# Patient Record
Sex: Male | Born: 1965 | Race: White | Hispanic: No | Smoking: Former smoker
Health system: Southern US, Community
[De-identification: ages and names within clinical notes are randomized; demographics above are authoritative.]

## PROBLEM LIST (undated history)

## (undated) DIAGNOSIS — N200 Calculus of kidney: Secondary | ICD-10-CM

## (undated) DIAGNOSIS — F4310 Post-traumatic stress disorder, unspecified: Secondary | ICD-10-CM

## (undated) DIAGNOSIS — R569 Unspecified convulsions: Secondary | ICD-10-CM

## (undated) DIAGNOSIS — F319 Bipolar disorder, unspecified: Secondary | ICD-10-CM

## (undated) HISTORY — PX: HERNIA REPAIR: SHX51

## (undated) HISTORY — PX: AMPUTATION: SHX166

## (undated) HISTORY — PX: BELOW KNEE LEG AMPUTATION: SUR23

---

## 2015-07-23 ENCOUNTER — Encounter (HOSPITAL_COMMUNITY): Payer: Self-pay | Admitting: *Deleted

## 2015-07-23 ENCOUNTER — Emergency Department (HOSPITAL_COMMUNITY)
Admission: EM | Admit: 2015-07-23 | Discharge: 2015-07-24 | Disposition: A | Discharge status: Home / Self Care | Payer: Medicare Other | Attending: Emergency Medicine | Admitting: Emergency Medicine

## 2015-07-23 ENCOUNTER — Emergency Department (HOSPITAL_COMMUNITY): Payer: Medicare Other

## 2015-07-23 DIAGNOSIS — R200 Anesthesia of skin: Secondary | ICD-10-CM | POA: Diagnosis not present

## 2015-07-23 DIAGNOSIS — G40909 Epilepsy, unspecified, not intractable, without status epilepticus: Secondary | ICD-10-CM | POA: Diagnosis not present

## 2015-07-23 DIAGNOSIS — Z79899 Other long term (current) drug therapy: Secondary | ICD-10-CM | POA: Insufficient documentation

## 2015-07-23 DIAGNOSIS — Z87442 Personal history of urinary calculi: Secondary | ICD-10-CM | POA: Insufficient documentation

## 2015-07-23 DIAGNOSIS — Z720 Tobacco use: Secondary | ICD-10-CM | POA: Insufficient documentation

## 2015-07-23 DIAGNOSIS — Z8659 Personal history of other mental and behavioral disorders: Secondary | ICD-10-CM | POA: Diagnosis not present

## 2015-07-23 HISTORY — DX: Calculus of kidney: N20.0

## 2015-07-23 HISTORY — DX: Unspecified convulsions: R56.9

## 2015-07-23 HISTORY — DX: Bipolar disorder, unspecified: F31.9

## 2015-07-23 LAB — I-STAT CHEM 8, ED
CREATININE: 0.9 mg/dL (ref 0.61–1.24)
Calcium, Ion: 1.1 mmol/L — ABNORMAL LOW (ref 1.12–1.23)
Chloride: 103 mmol/L (ref 101–111)
Glucose, Bld: 108 mg/dL — ABNORMAL HIGH (ref 65–99)
HEMATOCRIT: 50 % (ref 39.0–52.0)
Hemoglobin: 17 g/dL (ref 13.0–17.0)
Potassium: 4.5 mmol/L (ref 3.5–5.1)
Sodium: 142 mmol/L (ref 135–145)
TCO2: 24 mmol/L (ref 0–100)

## 2015-07-23 LAB — DIFFERENTIAL
Basophils Absolute: 0.1 10*3/uL (ref 0.0–0.1)
Basophils Relative: 1 %
EOS PCT: 1 %
Eosinophils Absolute: 0.2 10*3/uL (ref 0.0–0.7)
LYMPHS ABS: 3.8 10*3/uL (ref 0.7–4.0)
LYMPHS PCT: 34 %
MONO ABS: 0.7 10*3/uL (ref 0.1–1.0)
MONOS PCT: 6 %
Neutro Abs: 6.5 10*3/uL (ref 1.7–7.7)
Neutrophils Relative %: 58 %

## 2015-07-23 LAB — CBG MONITORING, ED: GLUCOSE-CAPILLARY: 112 mg/dL — AB (ref 65–99)

## 2015-07-23 LAB — CBC
HCT: 45.9 % (ref 39.0–52.0)
HEMOGLOBIN: 16.2 g/dL (ref 13.0–17.0)
MCH: 33.2 pg (ref 26.0–34.0)
MCHC: 35.3 g/dL (ref 30.0–36.0)
MCV: 94.1 fL (ref 78.0–100.0)
Platelets: 278 10*3/uL (ref 150–400)
RBC: 4.88 MIL/uL (ref 4.22–5.81)
RDW: 12.4 % (ref 11.5–15.5)
WBC: 11.2 10*3/uL — ABNORMAL HIGH (ref 4.0–10.5)

## 2015-07-23 LAB — APTT: aPTT: 29 seconds (ref 24–37)

## 2015-07-23 LAB — COMPREHENSIVE METABOLIC PANEL
ALK PHOS: 55 U/L (ref 38–126)
ALT: 81 U/L — ABNORMAL HIGH (ref 17–63)
ANION GAP: 9 (ref 5–15)
AST: 53 U/L — ABNORMAL HIGH (ref 15–41)
Albumin: 3.9 g/dL (ref 3.5–5.0)
BILIRUBIN TOTAL: 0.4 mg/dL (ref 0.3–1.2)
BUN: <5 mg/dL — ABNORMAL LOW (ref 6–20)
CO2: 25 mmol/L (ref 22–32)
Calcium: 9.2 mg/dL (ref 8.9–10.3)
Chloride: 106 mmol/L (ref 101–111)
Creatinine, Ser: 1.01 mg/dL (ref 0.61–1.24)
GLUCOSE: 107 mg/dL — AB (ref 65–99)
Potassium: 4.6 mmol/L (ref 3.5–5.1)
Sodium: 140 mmol/L (ref 135–145)
TOTAL PROTEIN: 7.7 g/dL (ref 6.5–8.1)

## 2015-07-23 LAB — PROTIME-INR
INR: 1.14 (ref 0.00–1.49)
Prothrombin Time: 14.8 seconds (ref 11.6–15.2)

## 2015-07-23 LAB — I-STAT TROPONIN, ED: TROPONIN I, POC: 0 ng/mL (ref 0.00–0.08)

## 2015-07-23 MED ORDER — ACETAMINOPHEN 325 MG PO TABS
650.0000 mg | ORAL_TABLET | Freq: Once | ORAL | Status: DC
  Filled 2015-07-23: qty 2

## 2015-07-23 MED ORDER — SODIUM CHLORIDE 0.9 % IV BOLUS (SEPSIS)
1000.0000 mL | Freq: Once | INTRAVENOUS | Status: AC
  Administered 2015-07-23: 1000 mL via INTRAVENOUS

## 2015-07-23 MED ORDER — HYDROCODONE-ACETAMINOPHEN 5-325 MG PO TABS: 1.0000 | ORAL_TABLET | Freq: Four times a day (QID) | ORAL | Status: DC | PRN

## 2015-07-23 MED ORDER — METOCLOPRAMIDE HCL 5 MG/ML IJ SOLN
20.0000 mg | Freq: Once | INTRAMUSCULAR | Status: DC
  Filled 2015-07-23: qty 4

## 2015-07-23 MED ORDER — IOHEXOL 350 MG/ML SOLN
80.0000 mL | Freq: Once | INTRAVENOUS | Status: AC | PRN
  Administered 2015-07-23: 80 mL via INTRAVENOUS

## 2015-07-23 MED ORDER — HYDROCODONE-ACETAMINOPHEN 5-325 MG PO TABS: 1.0000 | ORAL_TABLET | Freq: Once | ORAL | Status: DC

## 2015-07-23 MED ORDER — METOCLOPRAMIDE HCL 5 MG/ML IJ SOLN
10.0000 mg | Freq: Once | INTRAMUSCULAR | Status: AC
Start: 2015-07-23 — End: 2015-07-23
  Administered 2015-07-23: 10 mg via INTRAVENOUS
  Filled 2015-07-23: qty 2

## 2015-07-23 MED ORDER — DIPHENHYDRAMINE HCL 50 MG/ML IJ SOLN
25.0000 mg | Freq: Once | INTRAMUSCULAR | Status: AC
  Administered 2015-07-23: 25 mg via INTRAVENOUS
  Filled 2015-07-23: qty 1

## 2015-07-23 MED ORDER — ONDANSETRON HCL 4 MG/2ML IJ SOLN
4.0000 mg | Freq: Once | INTRAMUSCULAR | Status: AC
  Administered 2015-07-23: 4 mg via INTRAVENOUS
  Filled 2015-07-23: qty 2

## 2015-07-23 MED ORDER — KETOROLAC TROMETHAMINE 30 MG/ML IJ SOLN
30.0000 mg | Freq: Once | INTRAMUSCULAR | Status: AC
  Administered 2015-07-23: 30 mg via INTRAVENOUS
  Filled 2015-07-23: qty 1

## 2015-07-23 NOTE — ED Notes (Signed)
EDP explained MRI result and plan of care to pt.

## 2015-07-23 NOTE — ED Notes (Signed)
PT states about 45 minutes ago, started having sharp pain to left side of face, shaking all over, blurry vision bilaterally and developed a headache that is going down his neck. PT has left arm numbness and weakness.  Pt states he did have a couple of chest pains

## 2015-07-23 NOTE — Discharge Instructions (Signed)
As discussed, your evaluation today has been largely reassuring.  But, it is important that you monitor your condition carefully, and do not hesitate to return to the ED if you develop new, or concerning changes in your condition.  Your numbness and tingling on the left side are likely related to your headache.  It is important that you discussed today's presentation with your physician, and discuss appropriate medical options for headache control.

## 2015-07-23 NOTE — ED Provider Notes (Signed)
CSN: [id number]     Arrival date & time 07/23/15  1846 History   First MD Initiated Contact with Patient 07/23/15 1917     Chief Complaint  Patient presents with  . Code Stroke    An emergency department physician performed an initial assessment on this suspected stroke patient at 73. (Consider location/radiation/quality/duration/timing/severity/associated sxs/prior Treatment) HPI  Patient presents after the relatively sudden onset of left facial dysesthesia, left arm weakness and dysesthesia, and left leg weakness. Symptoms began about one hour ago. Since onset symptoms of been persistent. There is associated bilateral blurry vision. Patient was well prior to the onset of symptoms.   Additional interview conducted after the patient returned from emergent CT scan. Patient states that he also developed headache, left-sided about the time of symptom onset. Headache is severe. No recent medication changes, diet changes, activity changes.   Past Medical History  Diagnosis Date  . Kidney stones   . Bipolar 1 disorder (HCC)   . Seizures [facility])    Past Surgical History  Procedure Laterality Date  . Amputation    . Hernia repair     No family history on file. Social History  Substance Use Topics  . Smoking status: Current Every Day Smoker  . Smokeless tobacco: None  . Alcohol Use: No    Review of Systems  Constitutional:       Per HPI, otherwise negative  HENT:       Per HPI, otherwise negative  Eyes: Positive for visual disturbance.  Respiratory:       Per HPI, otherwise negative  Cardiovascular:       Per HPI, otherwise negative  Gastrointestinal: Negative for vomiting.  Endocrine:       Negative aside from HPI  Genitourinary:       Neg aside from HPI   Musculoskeletal:       Per HPI, otherwise negative  Skin: Negative.   Neurological: Positive for weakness, numbness and headaches. Negative for syncope.      Allergies  Clindamycin/lincomycin  Home  Medications   Prior to Admission medications   Medication Sig Start Date End Date Taking? Authorizing Provider  dicyclomine (BENTYL) 20 MG tablet Take 20 mg by mouth daily as needed for spasms.  04/26/15  Yes Historical Provider, MD  gabapentin (NEURONTIN) 400 MG capsule Take 400 mg by mouth 3 (three) times daily. 07/17/15  Yes Historical Provider, MD  ibuprofen (ADVIL,MOTRIN) 200 MG tablet Take 200 mg by mouth every 6 (six) hours as needed for mild pain or moderate pain.   Yes Historical Provider, MD  OXcarbazepine (TRILEPTAL) 150 MG tablet Take 150 mg by mouth 3 (three) times daily. 07/17/15  Yes Historical Provider, MD  prazosin (MINIPRESS) 2 MG capsule Take 2 mg by mouth at bedtime. 07/17/15  Yes Historical Provider, MD  SAPHRIS 5 MG SUBL 24 hr tablet Place 5 mg under the tongue at bedtime. 07/17/15  Yes Historical Provider, MD  tamsulosin (FLOMAX) 0.4 MG CAPS capsule Take 0.4 mg by mouth at bedtime. 07/17/15  Yes Historical Provider, MD  venlafaxine XR (EFFEXOR-XR) 150 MG 24 hr capsule Take 150 mg by mouth daily with breakfast.  05/02/15  Yes Historical Provider, MD   BP 151/67 mmHg  Pulse 75  Temp(Src) 98.7 F (37.1 C) (Oral)  Resp 16  SpO2 98% Physical Exam  Constitutional: He is oriented to person, place, and time. He appears well-developed. No distress.  Uncomfortable appearing male  HENT:  Head: Normocephalic and atraumatic.  Eyes: Conjunctivae  and EOM are normal.  Neck: Neck supple. Normal range of motion present.  Cardiovascular: Normal rate and regular rhythm.   Pulmonary/Chest: Effort normal. No stridor. No respiratory distress.  Abdominal: He exhibits no distension.  Musculoskeletal: He exhibits no edema.  Neurological: He is alert and oriented to person, place, and time. He displays no atrophy and no tremor. No cranial nerve deficit or sensory deficit. He exhibits normal muscle tone. He displays no seizure activity. Coordination normal.  Skin: Skin is warm and dry.   Psychiatric: He has a normal mood and affect.  Nursing note and vitals reviewed.   ED Course  Procedures (including critical care time)  Initial evaluation conducted with our neurology colleagues. Patient had emergent CT scan which was unremarkable. With the patient's discription of a new headache, as well as neck pain, patient returned to the CT scan for CT angiography.      Labs Review Labs Reviewed  CBC - Abnormal; Notable for the following:    WBC 11.2 (*)    All other components within normal limits  COMPREHENSIVE METABOLIC PANEL - Abnormal; Notable for the following:    Glucose, Bld 107 (*)    BUN <5 (*)    AST 53 (*)    ALT 81 (*)    All other components within normal limits  CBG MONITORING, ED - Abnormal; Notable for the following:    Glucose-Capillary 112 (*)    All other components within normal limits  I-STAT CHEM 8, ED - Abnormal; Notable for the following:    BUN <3 (*)    Glucose, Bld 108 (*)    Calcium, Ion 1.10 (*)    All other components within normal limits  PROTIME-INR  APTT  DIFFERENTIAL  I-STAT TROPOININ, ED    Imaging Review Ct Angio Head W/cm &/or Wo Cm  07/23/2015   CLINICAL DATA:  Left-sided numbness. Diffuse headache extending down the left side of the neck for 1 hour. Blurry vision. Left-sided weakness. Concern for dissection in the neck.  EXAM: CT ANGIOGRAPHY HEAD AND NECK  TECHNIQUE: Multidetector CT imaging of the head and neck was performed using the standard protocol during bolus administration of intravenous contrast. Multiplanar CT image reconstructions and MIPs were obtained to evaluate the vascular anatomy. Carotid stenosis measurements (when applicable) are obtained utilizing NASCET criteria, using the distal internal carotid diameter as the denominator.  CONTRAST:  80mL OMNIPAQUE IOHEXOL 350 MG/ML SOLN  COMPARISON:  Noncontrast head CT earlier today  FINDINGS: CTA NECK  Aortic arch: 3 vessel aortic arch. Brachiocephalic and left  subclavian arteries are widely patent. Prominent, predominantly noncalcified atherosclerotic plaque in the proximal right subclavian artery results in 50% stenosis.  Right carotid system: Widely patent without evidence of stenosis, aneurysm, or dissection.  Left carotid system: Widely patent without evidence of stenosis, dissection, or aneurysm.  Vertebral arteries: Patent with the left being dominant and the right being diffusely hypoplastic on a developmental basis. Mild non stenotic plaque in the left V3 segment.  Skeleton: Mild multilevel cervical disc and facet degeneration.  Other neck: Mild centrilobular and paraseptal emphysema in the lung apices.  CTA HEAD  Anterior circulation: Internal carotid arteries are patent from skullbase to carotid termini without stenosis. ACAs and MCAs are patent with mild branch vessel irregularity but no evidence of major branch vessel occlusion or significant proximal stenosis. No intracranial aneurysm is identified.  Posterior circulation: Intracranial vertebral arteries are patent. The left vertebral artery supplies the basilar, in the right ends in PICA.  Left PICA origin is patent as well, as are the AICA and SCA origins. Basilar artery is patent without stenosis. Posterior communicating arteries are not clearly identified. PCAs are patent with mild irregularity bilaterally but no significant proximal stenosis.  Venous sinuses: Patent.  Anatomic variants: Hypoplastic right vertebral artery ending in PICA.  Delayed phase: No abnormal enhancement. Nonspecific partially calcified lesion in the right occipital scalp.  IMPRESSION: 1. Widely patent cervical carotid and vertebral arteries without evidence of dissection. 2. 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 3. No medium or large vessel occlusion or significant proximal intracranial stenosis. Mild branch vessel irregularity which may reflect mild atherosclerosis.  These results were called by telephone at the time of  interpretation on 07/23/2015 at 8:12 pm to Dr. Thana Farr , who verbally acknowledged these results.   Electronically Signed   By: Sebastian Ache M.D.   On: 07/23/2015 20:19   Ct Head Wo Contrast  07/23/2015   CLINICAL DATA:  Right facial tingling, blurred vision, headache, history of seizures, code stroke  EXAM: CT HEAD WITHOUT CONTRAST  TECHNIQUE: Contiguous axial images were obtained from the base of the skull through the vertex without intravenous contrast.  COMPARISON:  None.  FINDINGS: No evidence of parenchymal hemorrhage or extra-axial fluid collection. No mass lesion, mass effect, or midline shift.  No CT evidence of acute infarction.  Cerebral volume is within normal limits.  No ventriculomegaly.  The visualized paranasal sinuses are essentially clear. The mastoid air cells are unopacified.  No evidence of calvarial fracture.  IMPRESSION: Normal head CT.  These results were called by telephone at the time of interpretation on 07/23/2015 at 7:24 pm to Dr. Thana Farr, who verbally acknowledged these results.   Electronically Signed   By: Charline Bills M.D.   On: 07/23/2015 19:24   Ct Angio Neck W/cm &/or Wo/cm  07/23/2015   CLINICAL DATA:  Left-sided numbness. Diffuse headache extending down the left side of the neck for 1 hour. Blurry vision. Left-sided weakness. Concern for dissection in the neck.  EXAM: CT ANGIOGRAPHY HEAD AND NECK  TECHNIQUE: Multidetector CT imaging of the head and neck was performed using the standard protocol during bolus administration of intravenous contrast. Multiplanar CT image reconstructions and MIPs were obtained to evaluate the vascular anatomy. Carotid stenosis measurements (when applicable) are obtained utilizing NASCET criteria, using the distal internal carotid diameter as the denominator.  CONTRAST:  80mL OMNIPAQUE IOHEXOL 350 MG/ML SOLN  COMPARISON:  Noncontrast head CT earlier today  FINDINGS: CTA NECK  Aortic arch: 3 vessel aortic arch.  Brachiocephalic and left subclavian arteries are widely patent. Prominent, predominantly noncalcified atherosclerotic plaque in the proximal right subclavian artery results in 50% stenosis.  Right carotid system: Widely patent without evidence of stenosis, aneurysm, or dissection.  Left carotid system: Widely patent without evidence of stenosis, dissection, or aneurysm.  Vertebral arteries: Patent with the left being dominant and the right being diffusely hypoplastic on a developmental basis. Mild non stenotic plaque in the left V3 segment.  Skeleton: Mild multilevel cervical disc and facet degeneration.  Other neck: Mild centrilobular and paraseptal emphysema in the lung apices.  CTA HEAD  Anterior circulation: Internal carotid arteries are patent from skullbase to carotid termini without stenosis. ACAs and MCAs are patent with mild branch vessel irregularity but no evidence of major branch vessel occlusion or significant proximal stenosis. No intracranial aneurysm is identified.  Posterior circulation: Intracranial vertebral arteries are patent. The left vertebral artery supplies the basilar, in the  right ends in PICA. Left PICA origin is patent as well, as are the AICA and SCA origins. Basilar artery is patent without stenosis. Posterior communicating arteries are not clearly identified. PCAs are patent with mild irregularity bilaterally but no significant proximal stenosis.  Venous sinuses: Patent.  Anatomic variants: Hypoplastic right vertebral artery ending in PICA.  Delayed phase: No abnormal enhancement. Nonspecific partially calcified lesion in the right occipital scalp.  IMPRESSION: 1. Widely patent cervical carotid and vertebral arteries without evidence of dissection. 2. 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 3. No medium or large vessel occlusion or significant proximal intracranial stenosis. Mild branch vessel irregularity which may reflect mild atherosclerosis.  These results were called by  telephone at the time of interpretation on 07/23/2015 at 8:12 pm to Dr. Thana Farr , who verbally acknowledged these results.   Electronically Signed   By: Sebastian Ache M.D.   On: 07/23/2015 20:19   I have personally reviewed and evaluated these images and lab results as part of my medical decision-making.   EKG Interpretation   Date/Time:  Tuesday July 23 2015 18:59:15 EDT Ventricular Rate:  89 PR Interval:  142 QRS Duration: 98 QT Interval:  356 QTC Calculation: 433 R Axis:   61 Text Interpretation:  Normal sinus rhythm with sinus arrhythmia Normal ECG  Sinus rhythm Artifact Borderline ECG Confirmed by Gerhard Munch  MD  [phone]) on 07/23/2015 7:17:52 PM      On return from CT angiography, the patient continues to complain of headache. I discussed this case again with our neurology colleagues, the patient will have MRI performed.  11:40 PM Patient awake, alert, speaking clearly, minimal symptoms. After discussion with our neurology colleagues, patient had MRI performed, after his initial evaluation was reassuring, low suspicion for aneurysm, mass MRI none demonstrates no stroke. 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  I discussed all findings with patient at length, discussed importance of taking all medication as directed. Patient requests pain medication for discharge. He was recently on a course of Percocet for kidney stone, states that these medications work well for him. I counseled patient on the need for following up with primary care to discuss appropriate analgesia for headache, given his use of multiple medications for his medical issues. Patient was amenable to following up, voiced understanding of this necessity.  MDM   Final diagnoses:  Left sided numbness  Left sided numbness   Patient presents with the sudden onset of left-sided dysesthesia, blurred  vision, headache. Patient's description of symptoms very slightly, but with concern for mass versus stroke versus dissection, patient had multiple imaging studies performed. Throughout, the patient had analgesia provided via IV medication. Patient remained hemodynamically stable, neurologically intact throughout the duration of his emergency department course. Studies largely reassuring, and with no evidence for ongoing infection, dissection, stroke, mass, the patient was discharged in stable  condition to follow-up with his primary care team.   Gerhard Munch, MD 07/23/15 2342

## 2015-07-23 NOTE — ED Notes (Signed)
Patient transported to MRI 

## 2015-07-23 NOTE — Progress Notes (Signed)
Code Stroke called on 49 y.o male. While driving alone today at 4782 Pt developed acute numbness on the left side of his face. The numbness traveled down his neck to his arm with associated tingling. Pt developed severe headache with blurry vision and weakness in left upper and lower extremity. Pt was able to drive himself to [facility]. Pertinent medical history includes left BKA, smoker and kidney stones. Currently rates his head ache 10/10 with pain behind both eyes. NIHSS completed yielding 1 for sensory impairment on left side. CBG 112. CT scan completed and Pt taken for CTA as well. Negative for acute changes per Neurologist Dr. Thad Ranger  Pt for treatment this evening for headache and MRI.

## 2015-07-23 NOTE — Consult Note (Signed)
Referring Physician: Jeraldine Loots    Chief Complaint: Left sided numbness  HPI: Sean Wyatt is an 49 y.o. male who was driving alone today and had the acute onset of numbness along the left side of his face.  This numbness travelled down his face and neck to his left arm.  He then developed a severe headache, became shaky in both upper extremities and his left lower extremity and felt as if he was about to pass out.  Patient pulled his car over.  Reports that he also felt some heart fluttering.  With no improvement in the numbness the patient presented for evaluation.  Patient is from [place].  Reports no significant history of headaches.  Current headache is frontal.  Rated at a 10/10.  Some associated nausea and although with some initial blurry vision currently has no visual complaints.  Initial NIHSS of 1.   Date last known well: Date: 07/23/2015 Time last known well: Time: 18:15 tPA Given: No: Minimal symptoms  MRankin: 0  Past Medical History  Diagnosis Date  . Kidney stones   . Bipolar 1 disorder (HCC)   . Seizures [facility])     Past Surgical History  Procedure Laterality Date  . Amputation    . Hernia repair      Family history:  Mother deceased from lung cancer.  Father alive with CAD s/p MI X 2.  Social History:  reports that he has been smoking.  He does not have any smokeless tobacco history on file. He reports that he does not drink alcohol or use illicit drugs.  Allergies:  Allergies  Allergen Reactions  . Clindamycin/Lincomycin     Medications: I have reviewed the patient's current medications. Prior to Admission:  Prior to Admission medications   Medication Sig Start Date End Date Taking? Authorizing Provider  dicyclomine (BENTYL) 20 MG tablet Take 20 mg by mouth daily as needed for spasms.  04/26/15  Yes Historical Provider, MD  gabapentin (NEURONTIN) 400 MG capsule Take 400 mg by mouth 3 (three) times daily. 07/17/15  Yes Historical Provider, MD  ibuprofen  (ADVIL,MOTRIN) 200 MG tablet Take 200 mg by mouth every 6 (six) hours as needed for mild pain or moderate pain.   Yes Historical Provider, MD  OXcarbazepine (TRILEPTAL) 150 MG tablet Take 150 mg by mouth 3 (three) times daily. 07/17/15  Yes Historical Provider, MD  prazosin (MINIPRESS) 2 MG capsule Take 2 mg by mouth at bedtime. 07/17/15  Yes Historical Provider, MD  SAPHRIS 5 MG SUBL 24 hr tablet Place 5 mg under the tongue at bedtime. 07/17/15  Yes Historical Provider, MD  tamsulosin (FLOMAX) 0.4 MG CAPS capsule Take 0.4 mg by mouth at bedtime. 07/17/15  Yes Historical Provider, MD  venlafaxine XR (EFFEXOR-XR) 150 MG 24 hr capsule Take 150 mg by mouth daily with breakfast.  05/02/15  Yes Historical Provider, MD    ROS: History obtained from the patient  General ROS: negative for - chills, fatigue, fever, night sweats, weight gain or weight loss Psychological ROS: negative for - behavioral disorder, hallucinations, memory difficulties, mood swings or suicidal ideation Ophthalmic ROS: negative for - blurry vision, double vision, eye pain or loss of vision ENT ROS: negative for - epistaxis, nasal discharge, oral lesions, sore throat, tinnitus or vertigo Allergy and Immunology ROS: negative for - hives or itchy/watery eyes Hematological and Lymphatic ROS: negative for - bleeding problems, bruising or swollen lymph nodes Endocrine ROS: negative for - galactorrhea, hair pattern changes, polydipsia/polyuria or temperature intolerance Respiratory  ROS: negative for - cough, hemoptysis, shortness of breath or wheezing Cardiovascular ROS: as noted in HPI Gastrointestinal ROS: as noted in HPI Genito-Urinary ROS: negative for - dysuria, hematuria, incontinence or urinary frequency/urgency Musculoskeletal ROS: Left BKA Neurological ROS: as noted in HPI Dermatological ROS: negative for rash and skin lesion changes  Physical Examination: Blood pressure 151/67, pulse 75, temperature 98.7 F (37.1 C),  temperature source Oral, resp. rate 16, SpO2 98 %.  HEENT-  Normocephalic, no lesions, without obvious abnormality.  Normal external eye and conjunctiva.  Normal TM's bilaterally.  Normal auditory canals and external ears. Normal external nose, mucus membranes and septum.  Normal pharynx. Cardiovascular- S1, S2 normal, pulses palpable throughout   Lungs- chest clear, no wheezing, rales, normal symmetric air entry Abdomen- soft, non-tender; bowel sounds normal; no masses,  no organomegaly Extremities- no edema, left BKA Lymph-no adenopathy palpable Musculoskeletal-no joint tenderness, deformity or swelling Skin-warm and dry, no hyperpigmentation, vitiligo, or suspicious lesions  Neurological Examination Mental Status: Alert, oriented, thought content appropriate.  Speech fluent without evidence of aphasia.  Able to follow 3 step commands without difficulty. Cranial Nerves: II: Discs flat bilaterally; Visual fields grossly normal, pupils equal, round, reactive to light and accommodation III,IV, VI: ptosis not present, extra-ocular motions intact bilaterally V,VII: smile symmetric, facial light touch sensation decreased on the left VIII: hearing normal bilaterally IX,X: gag reflex present XI: bilateral shoulder shrug XII: midline tongue extension Motor: Right : Upper extremity   5/5    Left:     Upper extremity   5/5  Lower extremity   5/5     Lower extremity   5/5 Tone and bulk:normal tone throughout; no atrophy noted Sensory: Pinprick and light touch decreased on the left upper extremity Deep Tendon Reflexes: 2+ in the upper extremities and right lower extremity.  Left BKA Plantars: Right: downgoing   Left: BKA Cerebellar: Normal finger-to-nose and normal heel-to-shin testing bilaterally Gait: not tested due to pain   Laboratory Studies:  Basic Metabolic Panel:  Recent Labs Lab 07/23/15 1912 07/23/15 1918  NA 140 142  K 4.6 4.5  CL 106 103  CO2 25  --   GLUCOSE 107* 108*   BUN <5* <3*  CREATININE 1.01 0.90  CALCIUM 9.2  --     Liver Function Tests:  Recent Labs Lab 07/23/15 1912  AST 53*  ALT 81*  ALKPHOS 55  BILITOT 0.4  PROT 7.7  ALBUMIN 3.9   No results for input(s): LIPASE, AMYLASE in the last 168 hours. No results for input(s): AMMONIA in the last 168 hours.  CBC:  Recent Labs Lab 07/23/15 1912 07/23/15 1918  WBC 11.2*  --   NEUTROABS 6.5  --   HGB 16.2 17.0  HCT 45.9 50.0  MCV 94.1  --   PLT 278  --     Cardiac Enzymes: No results for input(s): CKTOTAL, CKMB, CKMBINDEX, TROPONINI in the last 168 hours.  BNP: Invalid input(s): POCBNP  CBG:  Recent Labs Lab 07/23/15 1904  GLUCAP 112*    Microbiology: No results found for this or any previous visit.  Coagulation Studies:  Recent Labs  07/23/15 1912  LABPROT 14.8  INR 1.14    Urinalysis: No results for input(s): COLORURINE, LABSPEC, PHURINE, GLUCOSEU, HGBUR, BILIRUBINUR, KETONESUR, PROTEINUR, UROBILINOGEN, NITRITE, LEUKOCYTESUR in the last 168 hours.  Invalid input(s): APPERANCEUR  Lipid Panel: No results found for: CHOL, TRIG, HDL, CHOLHDL, VLDL, LDLCALC  HgbA1C: No results found for: HGBA1C  Urine Drug Screen:  No  results found for: LABOPIA, COCAINSCRNUR, LABBENZ, AMPHETMU, THCU, LABBARB  Alcohol Level: No results for input(s): ETH in the last 168 hours.  Other results: EKG: normal sinus rhythm at 89 bpm.  Imaging: Ct Angio Head W/cm &/or Wo Cm  07/23/2015   CLINICAL DATA:  Left-sided numbness. Diffuse headache extending down the left side of the neck for 1 hour. Blurry vision. Left-sided weakness. Concern for dissection in the neck.  EXAM: CT ANGIOGRAPHY HEAD AND NECK  TECHNIQUE: Multidetector CT imaging of the head and neck was performed using the standard protocol during bolus administration of intravenous contrast. Multiplanar CT image reconstructions and MIPs were obtained to evaluate the vascular anatomy. Carotid stenosis measurements (when  applicable) are obtained utilizing NASCET criteria, using the distal internal carotid diameter as the denominator.  CONTRAST:  80mL OMNIPAQUE IOHEXOL 350 MG/ML SOLN  COMPARISON:  Noncontrast head CT earlier today  FINDINGS: CTA NECK  Aortic arch: 3 vessel aortic arch. Brachiocephalic and left subclavian arteries are widely patent. Prominent, predominantly noncalcified atherosclerotic plaque in the proximal right subclavian artery results in 50% stenosis.  Right carotid system: Widely patent without evidence of stenosis, aneurysm, or dissection.  Left carotid system: Widely patent without evidence of stenosis, dissection, or aneurysm.  Vertebral arteries: Patent with the left being dominant and the right being diffusely hypoplastic on a developmental basis. Mild non stenotic plaque in the left V3 segment.  Skeleton: Mild multilevel cervical disc and facet degeneration.  Other neck: Mild centrilobular and paraseptal emphysema in the lung apices.  CTA HEAD  Anterior circulation: Internal carotid arteries are patent from skullbase to carotid termini without stenosis. ACAs and MCAs are patent with mild branch vessel irregularity but no evidence of major branch vessel occlusion or significant proximal stenosis. No intracranial aneurysm is identified.  Posterior circulation: Intracranial vertebral arteries are patent. The left vertebral artery supplies the basilar, in the right ends in PICA. Left PICA origin is patent as well, as are the AICA and SCA origins. Basilar artery is patent without stenosis. Posterior communicating arteries are not clearly identified. PCAs are patent with mild irregularity bilaterally but no significant proximal stenosis.  Venous sinuses: Patent.  Anatomic variants: Hypoplastic right vertebral artery ending in PICA.  Delayed phase: No abnormal enhancement. Nonspecific partially calcified lesion in the right occipital scalp.  IMPRESSION: 1. Widely patent cervical carotid and vertebral arteries  without evidence of dissection. 2. 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 3. No medium or large vessel occlusion or significant proximal intracranial stenosis. Mild branch vessel irregularity which may reflect mild atherosclerosis.  These results were called by telephone at the time of interpretation on 07/23/2015 at 8:12 pm to Dr. Thana Farr , who verbally acknowledged these results.   Electronically Signed   By: Sebastian Ache M.D.   On: 07/23/2015 20:19   Ct Head Wo Contrast  07/23/2015   CLINICAL DATA:  Right facial tingling, blurred vision, headache, history of seizures, code stroke  EXAM: CT HEAD WITHOUT CONTRAST  TECHNIQUE: Contiguous axial images were obtained from the base of the skull through the vertex without intravenous contrast.  COMPARISON:  None.  FINDINGS: No evidence of parenchymal hemorrhage or extra-axial fluid collection. No mass lesion, mass effect, or midline shift.  No CT evidence of acute infarction.  Cerebral volume is within normal limits.  No ventriculomegaly.  The visualized paranasal sinuses are essentially clear. The mastoid air cells are unopacified.  No evidence of calvarial fracture.  IMPRESSION: Normal head CT.  These results were called  by telephone at the time of interpretation on 07/23/2015 at 7:24 pm to Dr. Thana Farr, who verbally acknowledged these results.   Electronically Signed   By: Charline Bills M.D.   On: 07/23/2015 19:24   Ct Angio Neck W/cm &/or Wo/cm  07/23/2015   CLINICAL DATA:  Left-sided numbness. Diffuse headache extending down the left side of the neck for 1 hour. Blurry vision. Left-sided weakness. Concern for dissection in the neck.  EXAM: CT ANGIOGRAPHY HEAD AND NECK  TECHNIQUE: Multidetector CT imaging of the head and neck was performed using the standard protocol during bolus administration of intravenous contrast. Multiplanar CT image reconstructions and MIPs were obtained to evaluate the vascular anatomy. Carotid stenosis  measurements (when applicable) are obtained utilizing NASCET criteria, using the distal internal carotid diameter as the denominator.  CONTRAST:  80mL OMNIPAQUE IOHEXOL 350 MG/ML SOLN  COMPARISON:  Noncontrast head CT earlier today  FINDINGS: CTA NECK  Aortic arch: 3 vessel aortic arch. Brachiocephalic and left subclavian arteries are widely patent. Prominent, predominantly noncalcified atherosclerotic plaque in the proximal right subclavian artery results in 50% stenosis.  Right carotid system: Widely patent without evidence of stenosis, aneurysm, or dissection.  Left carotid system: Widely patent without evidence of stenosis, dissection, or aneurysm.  Vertebral arteries: Patent with the left being dominant and the right being diffusely hypoplastic on a developmental basis. Mild non stenotic plaque in the left V3 segment.  Skeleton: Mild multilevel cervical disc and facet degeneration.  Other neck: Mild centrilobular and paraseptal emphysema in the lung apices.  CTA HEAD  Anterior circulation: Internal carotid arteries are patent from skullbase to carotid termini without stenosis. ACAs and MCAs are patent with mild branch vessel irregularity but no evidence of major branch vessel occlusion or significant proximal stenosis. No intracranial aneurysm is identified.  Posterior circulation: Intracranial vertebral arteries are patent. The left vertebral artery supplies the basilar, in the right ends in PICA. Left PICA origin is patent as well, as are the AICA and SCA origins. Basilar artery is patent without stenosis. Posterior communicating arteries are not clearly identified. PCAs are patent with mild irregularity bilaterally but no significant proximal stenosis.  Venous sinuses: Patent.  Anatomic variants: Hypoplastic right vertebral artery ending in PICA.  Delayed phase: No abnormal enhancement. Nonspecific partially calcified lesion in the right occipital scalp.  IMPRESSION: 1. Widely patent cervical carotid and  vertebral arteries without evidence of dissection. 2. 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 3. No medium or large vessel occlusion or significant proximal intracranial stenosis. Mild branch vessel irregularity which may reflect mild atherosclerosis.  These results were called by telephone at the time of interpretation on 07/23/2015 at 8:12 pm to Dr. Thana Farr , who verbally acknowledged these results.   Electronically Signed   By: Sebastian Ache M.D.   On: 07/23/2015 20:19    Assessment: 49 y.o. male presenting with complaints of headache and left sided numbness of acute onset.  Patient with a risk factor of smoking.  On no antiplatelet therapy.  Head CT personally reviewed and shows no acute changes.  Symptoms currently minimal but with most prominent complaint being that of pain in head and neck further work up recommended.    Stroke Risk Factors - smoking  Plan: 1. CTA of head and neck  Case discussed with Dr. Sandre Kitty, MD [facility] [phone] 07/23/2015, 8:36 PM  Addendum: CTA of the head and neck reviewed and shows no significant stenosis or evidence of dissection.  Presentation  may represent a complicated migraine.    Recommendations: 1.  Headache cocktail 2.  MRI of the brain without contrast to rule out acute infarct.  If unremarkable, stroke work up not indicated.    Thana Farr, MD [facility] [phone]  Addendum: MRI of the brain is normal.  No evidence of acute infarct.  Complicated migraine suspected.    Recommendations: 1.  ASA 81mg  daily.  Follow up with PCP on returning home.    Thana Farr, MD [facility] [phone]

## 2015-07-23 NOTE — ED Notes (Signed)
Patient is alert and orientedx4.  Patient was explained discharge instructions and they understood them with no questions.   

## 2015-07-23 NOTE — ED Notes (Signed)
Transported to CT scan after initial  EDP evalaution .

## 2015-07-23 NOTE — ED Notes (Addendum)
Pt. transported back to CT scan after evaluated by Dr. Thad Ranger ( neurologist) .  Neurologist advised pharmacist that pt. will no receive TPA .

## 2015-07-24 ENCOUNTER — Encounter (HOSPITAL_COMMUNITY): Payer: Self-pay | Admitting: Emergency Medicine

## 2015-07-24 ENCOUNTER — Emergency Department (HOSPITAL_COMMUNITY)
Admission: EM | Admit: 2015-07-24 | Discharge: 2015-07-25 | Disposition: A | Discharge status: Other Acute Inpatient Hospital | Payer: Medicare Other | Attending: Physician Assistant | Admitting: Physician Assistant

## 2015-07-24 DIAGNOSIS — Z720 Tobacco use: Secondary | ICD-10-CM | POA: Insufficient documentation

## 2015-07-24 DIAGNOSIS — F419 Anxiety disorder, unspecified: Secondary | ICD-10-CM | POA: Insufficient documentation

## 2015-07-24 DIAGNOSIS — F3132 Bipolar disorder, current episode depressed, moderate: Secondary | ICD-10-CM | POA: Diagnosis not present

## 2015-07-24 DIAGNOSIS — F1110 Opioid abuse, uncomplicated: Secondary | ICD-10-CM | POA: Insufficient documentation

## 2015-07-24 DIAGNOSIS — F319 Bipolar disorder, unspecified: Secondary | ICD-10-CM | POA: Diagnosis not present

## 2015-07-24 DIAGNOSIS — Z79899 Other long term (current) drug therapy: Secondary | ICD-10-CM | POA: Diagnosis not present

## 2015-07-24 DIAGNOSIS — M545 Low back pain: Secondary | ICD-10-CM | POA: Insufficient documentation

## 2015-07-24 DIAGNOSIS — Z87442 Personal history of urinary calculi: Secondary | ICD-10-CM | POA: Insufficient documentation

## 2015-07-24 DIAGNOSIS — F1310 Sedative, hypnotic or anxiolytic abuse, uncomplicated: Secondary | ICD-10-CM | POA: Insufficient documentation

## 2015-07-24 DIAGNOSIS — R45851 Suicidal ideations: Secondary | ICD-10-CM | POA: Diagnosis present

## 2015-07-24 DIAGNOSIS — G40909 Epilepsy, unspecified, not intractable, without status epilepticus: Secondary | ICD-10-CM | POA: Insufficient documentation

## 2015-07-24 LAB — COMPREHENSIVE METABOLIC PANEL
ALBUMIN: 3.8 g/dL (ref 3.5–5.0)
ALK PHOS: 54 U/L (ref 38–126)
ALT: 77 U/L — AB (ref 17–63)
AST: 53 U/L — AB (ref 15–41)
Anion gap: 11 (ref 5–15)
BILIRUBIN TOTAL: 0.5 mg/dL (ref 0.3–1.2)
CO2: 26 mmol/L (ref 22–32)
CREATININE: 0.96 mg/dL (ref 0.61–1.24)
Calcium: 9.3 mg/dL (ref 8.9–10.3)
Chloride: 105 mmol/L (ref 101–111)
GFR calc Af Amer: >60 mL/min (ref >60)
GLUCOSE: 127 mg/dL — AB (ref 65–99)
POTASSIUM: 3.9 mmol/L (ref 3.5–5.1)
Sodium: 142 mmol/L (ref 135–145)
TOTAL PROTEIN: 7.5 g/dL (ref 6.5–8.1)

## 2015-07-24 LAB — SALICYLATE LEVEL: Salicylate Lvl: <4 mg/dL (ref 2.8–30.0)

## 2015-07-24 LAB — CBC
HEMATOCRIT: 42.3 % (ref 39.0–52.0)
Hemoglobin: 14.1 g/dL (ref 13.0–17.0)
MCH: 31.5 pg (ref 26.0–34.0)
MCHC: 33.3 g/dL (ref 30.0–36.0)
MCV: 94.6 fL (ref 78.0–100.0)
PLATELETS: 256 10*3/uL (ref 150–400)
RBC: 4.47 MIL/uL (ref 4.22–5.81)
RDW: 12.3 % (ref 11.5–15.5)
WBC: 9.1 10*3/uL (ref 4.0–10.5)

## 2015-07-24 LAB — RAPID URINE DRUG SCREEN, HOSP PERFORMED
AMPHETAMINES: NOT DETECTED
BARBITURATES: NOT DETECTED
BENZODIAZEPINES: POSITIVE — AB
Cocaine: NOT DETECTED
Opiates: POSITIVE — AB
Tetrahydrocannabinol: NOT DETECTED

## 2015-07-24 LAB — ACETAMINOPHEN LEVEL: Acetaminophen (Tylenol), Serum: 16 ug/mL (ref 10–30)

## 2015-07-24 LAB — ETHANOL

## 2015-07-24 MED ORDER — IBUPROFEN 200 MG PO TABS: 600.0000 mg | ORAL_TABLET | Freq: Three times a day (TID) | ORAL | Status: DC | PRN

## 2015-07-24 MED ORDER — PRAZOSIN HCL 2 MG PO CAPS
2.0000 mg | ORAL_CAPSULE | Freq: Every day | ORAL | Status: DC
  Filled 2015-07-24: qty 1

## 2015-07-24 MED ORDER — LORAZEPAM 0.5 MG PO TABS
0.5000 mg | ORAL_TABLET | Freq: Once | ORAL | Status: AC
  Administered 2015-07-24: 0.5 mg via ORAL
  Filled 2015-07-24: qty 1

## 2015-07-24 MED ORDER — OXCARBAZEPINE 150 MG PO TABS
150.0000 mg | ORAL_TABLET | Freq: Three times a day (TID) | ORAL | Status: DC
  Administered 2015-07-24 – 2015-07-25 (×2): 150 mg via ORAL
  Filled 2015-07-24 (×4): qty 1

## 2015-07-24 MED ORDER — VENLAFAXINE HCL ER 150 MG PO CP24
150.0000 mg | ORAL_CAPSULE | Freq: Every day | ORAL | Status: DC
  Administered 2015-07-25: 150 mg via ORAL
  Filled 2015-07-24: qty 1

## 2015-07-24 MED ORDER — LORAZEPAM 1 MG PO TABS
1.0000 mg | ORAL_TABLET | Freq: Three times a day (TID) | ORAL | Status: DC | PRN
  Administered 2015-07-24 – 2015-07-25 (×2): 1 mg via ORAL
  Filled 2015-07-24 (×2): qty 1

## 2015-07-24 MED ORDER — CLONAZEPAM 0.5 MG PO TABS
1.0000 mg | ORAL_TABLET | Freq: Three times a day (TID) | ORAL | Status: DC | PRN
  Administered 2015-07-24: 1 mg via ORAL
  Filled 2015-07-24: qty 2

## 2015-07-24 MED ORDER — ACETAMINOPHEN 325 MG PO TABS: 650.0000 mg | ORAL_TABLET | ORAL | Status: DC | PRN

## 2015-07-24 NOTE — ED Notes (Signed)
States having a panic attack today and having tremors.  Alert answering and following commands appropriate. Denies HI states SI "to help ease my pain"
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
## 2015-07-24 NOTE — ED Notes (Signed)
Patient removed prostatic leg at bed side.

## 2015-07-24 NOTE — ED Notes (Signed)
Coffee and dinner given

## 2015-07-24 NOTE — ED Notes (Signed)
TTS montior at bedside

## 2015-07-24 NOTE — BH Assessment (Signed)
Consulted with Sabino DickShiela Agustin, NP who recommends patient be observed overnight in the ED for a telepsychi in the morning. Contacted patients nurse at [phone] to inform of disposition.   Davina PokeJoVea Trejuan Matherne, LCSW Therapeutic Triage Specialist [facility] 07/24/2015 6:07 PM

## 2015-07-24 NOTE — ED Provider Notes (Signed)
CSN: [id number]     Arrival date & time 07/24/15  1552 History   First MD Initiated Contact with Patient 07/24/15 1729     Chief Complaint  Patient presents with  . Panic Attack  . 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 Patient presents that he's been increasingly anxious. When he is alone he feels like his thoughts make him want to do something to himself. No distinct plan.  Past Medical History  Diagnosis Date  . Kidney stones   . Bipolar 1 disorder (HCC)   . Seizures [facility])    Past Surgical History  Procedure Laterality Date  . Amputation    . Hernia repair     No family history on file. Social History  Substance Use Topics  . Smoking status: Current Every Day Smoker  . Smokeless tobacco: None  . Alcohol Use: No    Review of Systems  Constitutional: Negative for activity change.  Respiratory: Negative for shortness of breath.   Cardiovascular: Negative for chest pain.  Gastrointestinal: Negative for abdominal pain.  Musculoskeletal: Positive for back pain.  Psychiatric/Behavioral: Positive for suicidal ideas. Negative for confusion.      Allergies  Clindamycin/lincomycin; Seroquel; and Trazamine  Home Medications   Prior to Admission medications   Medication Sig Start Date End Date Taking? Authorizing Provider  clonazePAM (KLONOPIN) 1 MG tablet Take 1 mg by mouth 3 (three) times daily as needed for anxiety.   Yes Historical Provider, MD  dicyclomine (BENTYL) 20 MG tablet Take 20 mg by mouth daily as needed for spasms.  04/26/15  Yes Historical Provider, MD  HYDROcodone-acetaminophen (NORCO/VICODIN) 5-325 MG tablet Take 1 tablet by mouth every 6 (six) hours as needed for severe pain. 07/23/15  Yes Gerhard Munch, MD  ibuprofen (ADVIL,MOTRIN) 200 MG tablet Take 200 mg by mouth every 6  (six) hours as needed for mild pain or moderate pain.   Yes Historical Provider, MD  OXcarbazepine (TRILEPTAL) 150 MG tablet Take 150 mg by mouth 3 (three) times daily. 07/17/15  Yes Historical Provider, MD  prazosin (MINIPRESS) 2 MG capsule Take 2 mg by mouth at bedtime. 07/17/15  Yes Historical Provider, MD  SAPHRIS 5 MG SUBL 24 hr tablet Place 5 mg under the tongue at bedtime. 07/17/15  Yes Historical Provider, MD  venlafaxine XR (EFFEXOR-XR) 150 MG 24 hr capsule Take 150 mg by mouth daily with breakfast.  05/02/15  Yes Historical Provider, MD   BP 120/66 mmHg  Pulse 108  Temp(Src) 97.9 F (36.6 C) (Oral)  Resp 14  Ht 6' (1.829 m)  Wt 210 lb (95.255 kg)  BMI 28.47 kg/m2  SpO2 98% Physical Exam  Constitutional: He is oriented to person, place, and time. He appears well-nourished.  HENT:  Head: Normocephalic.  Mouth/Throat: Oropharynx is clear and moist.  Eyes: Conjunctivae are normal.  Neck: No tracheal deviation present.  Cardiovascular: Normal rate.   Pulmonary/Chest: Effort normal. No stridor. No respiratory distress.  Abdominal: Soft. There is no tenderness. There is no guarding.  Musculoskeletal: He exhibits no edema.  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 No cranial nerve deficit.  Skin: Skin is warm and dry. No rash noted. He is not diaphoretic.  Psychiatric: He has a normal mood and affect. His behavior is normal.  Nursing note and vitals reviewed.   ED Course  Procedures (  including critical care time) Labs Review Labs Reviewed  COMPREHENSIVE METABOLIC PANEL - Abnormal; Notable for the following:    Glucose, Bld 127 (*)    BUN <5 (*)    AST 53 (*)    ALT 77 (*)    All other components within normal limits  ETHANOL  SALICYLATE LEVEL  ACETAMINOPHEN LEVEL  CBC  URINE RAPID DRUG SCREEN, HOSP PERFORMED    Imaging Review Ct Angio Head W/cm &/or Wo Cm  07/23/2015  CLINICAL DATA:  Left-sided numbness. Diffuse headache extending down  the left side of the neck for 1 hour. Blurry vision. Left-sided weakness. Concern for dissection in the neck. EXAM: CT ANGIOGRAPHY HEAD AND NECK TECHNIQUE: Multidetector CT imaging of the head and neck was performed using the standard protocol during bolus administration of intravenous contrast. Multiplanar CT image reconstructions and MIPs were obtained to evaluate the vascular anatomy. Carotid stenosis measurements (when applicable) are obtained utilizing NASCET criteria, using the distal internal carotid diameter as the denominator. CONTRAST:  80mL OMNIPAQUE IOHEXOL 350 MG/ML SOLN COMPARISON:  Noncontrast head CT earlier today FINDINGS: CTA NECK Aortic arch: 3 vessel aortic arch. Brachiocephalic and left subclavian arteries are widely patent. Prominent, predominantly noncalcified atherosclerotic plaque in the proximal right subclavian artery results in 50% stenosis. Right carotid system: Widely patent without evidence of stenosis, aneurysm, or dissection. Left carotid system: Widely patent without evidence of stenosis, dissection, or aneurysm. Vertebral arteries: Patent with the left being dominant and the right being diffusely hypoplastic on a developmental basis. Mild non stenotic plaque in the left V3 segment. Skeleton: Mild multilevel cervical disc and facet degeneration. Other neck: Mild centrilobular and paraseptal emphysema in the lung apices. CTA HEAD Anterior circulation: Internal carotid arteries are patent from skullbase to carotid termini without stenosis. ACAs and MCAs are patent with mild branch vessel irregularity but no evidence of major branch vessel occlusion or significant proximal stenosis. No intracranial aneurysm is identified. Posterior circulation: Intracranial vertebral arteries are patent. The left vertebral artery supplies the basilar, in the right ends in PICA. Left PICA origin is patent as well, as are the AICA and SCA origins. Basilar artery is patent without stenosis. Posterior  communicating arteries are not clearly identified. PCAs are patent with mild irregularity bilaterally but no significant proximal stenosis. Venous sinuses: Patent. Anatomic variants: Hypoplastic right vertebral artery ending in PICA. Delayed phase: No abnormal enhancement. Nonspecific partially calcified lesion in the right occipital scalp. IMPRESSION: 1. Widely patent cervical carotid and vertebral arteries without evidence of dissection. 2. 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 3. No medium or large vessel occlusion or significant proximal intracranial stenosis. Mild branch vessel irregularity which may reflect mild atherosclerosis. These results were called by telephone at the time of interpretation on 07/23/2015 at 8:12 pm to Dr. Thana Farr , who verbally acknowledged these results. Electronically Signed   By: Sebastian Ache M.D.   On: 07/23/2015 20:19   Ct Head Wo Contrast  07/23/2015  CLINICAL DATA:  Right facial tingling, blurred vision, headache, history of seizures, code stroke EXAM: CT HEAD WITHOUT CONTRAST TECHNIQUE: Contiguous axial images were obtained from the base of the skull through the vertex without intravenous contrast. COMPARISON:  None. FINDINGS: No evidence of parenchymal hemorrhage or extra-axial fluid collection. No mass lesion, mass effect, or midline shift. No CT evidence of acute infarction. Cerebral volume is within normal limits.  No ventriculomegaly. The visualized paranasal sinuses are essentially clear. The mastoid air cells are unopacified. No evidence of calvarial fracture. IMPRESSION:  Normal head CT. These results were called by telephone at the time of interpretation on 07/23/2015 at 7:24 pm to Dr. Thana FarrLeslie Reynolds, who verbally acknowledged these results. Electronically Signed   By: Charline BillsSriyesh  Krishnan M.D.   On: 07/23/2015 19:24   Ct Angio Neck W/cm &/or Wo/cm  07/23/2015  CLINICAL DATA:  Left-sided numbness. Diffuse headache extending down the left side of the  neck for 1 hour. Blurry vision. Left-sided weakness. Concern for dissection in the neck. EXAM: CT ANGIOGRAPHY HEAD AND NECK TECHNIQUE: Multidetector CT imaging of the head and neck was performed using the standard protocol during bolus administration of intravenous contrast. Multiplanar CT image reconstructions and MIPs were obtained to evaluate the vascular anatomy. Carotid stenosis measurements (when applicable) are obtained utilizing NASCET criteria, using the distal internal carotid diameter as the denominator. CONTRAST:  80mL OMNIPAQUE IOHEXOL 350 MG/ML SOLN COMPARISON:  Noncontrast head CT earlier today FINDINGS: CTA NECK Aortic arch: 3 vessel aortic arch. Brachiocephalic and left subclavian arteries are widely patent. Prominent, predominantly noncalcified atherosclerotic plaque in the proximal right subclavian artery results in 50% stenosis. Right carotid system: Widely patent without evidence of stenosis, aneurysm, or dissection. Left carotid system: Widely patent without evidence of stenosis, dissection, or aneurysm. Vertebral arteries: Patent with the left being dominant and the right being diffusely hypoplastic on a developmental basis. Mild non stenotic plaque in the left V3 segment. Skeleton: Mild multilevel cervical disc and facet degeneration. Other neck: Mild centrilobular and paraseptal emphysema in the lung apices. CTA HEAD Anterior circulation: Internal carotid arteries are patent from skullbase to carotid termini without stenosis. ACAs and MCAs are patent with mild branch vessel irregularity but no evidence of major branch vessel occlusion or significant proximal stenosis. No intracranial aneurysm is identified. Posterior circulation: Intracranial vertebral arteries are patent. The left vertebral artery supplies the basilar, in the right ends in PICA. Left PICA origin is patent as well, as are the AICA and SCA origins. Basilar artery is patent without stenosis. Posterior communicating arteries  are not clearly identified. PCAs are patent with mild irregularity bilaterally but no significant proximal stenosis. Venous sinuses: Patent. Anatomic variants: Hypoplastic right vertebral artery ending in PICA. Delayed phase: No abnormal enhancement. Nonspecific partially calcified lesion in the right occipital scalp. IMPRESSION: 1. Widely patent cervical carotid and vertebral arteries without evidence of dissection. 2. 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 3. No medium or large vessel occlusion or significant proximal intracranial stenosis. Mild branch vessel irregularity which may reflect mild atherosclerosis. These results were called by telephone at the time of interpretation on 07/23/2015 at 8:12 pm to Dr. Thana FarrLESLIE REYNOLDS , who verbally acknowledged these results. Electronically Signed   By: Sebastian AcheAllen  Grady M.D.   On: 07/23/2015 20:19   Mr Brain Wo Contrast  07/23/2015  CLINICAL DATA:  Acute onset LEFT facial numbness while driving, numbness extended to arm, severe headache and blurry vision. History of seizures. EXAM: MRI HEAD WITHOUT CONTRAST TECHNIQUE: Multiplanar, multiecho pulse sequences of the brain and surrounding structures were obtained without intravenous contrast. COMPARISON:  CT angiogram of the head July 23, 2015 at 2000 hours. FINDINGS: The ventricles and sulci are normal for patient's age. No abnormal parenchymal signal, mass lesions, mass effect. No reduced diffusion to suggest acute ischemia. No susceptibility artifact to suggest hemorrhage. No abnormal extra-axial fluid collections. No extra-axial masses though, contrast enhanced sequences would be more sensitive. Normal major intracranial vascular flow voids seen at the skull base. Ocular globes and orbital contents are unremarkable though not  tailored for evaluation. No abnormal sellar expansion. Visualized paranasal sinuses and mastoid air cells are well-aerated. No suspicious calvarial bone marrow signal. RIGHT parietal  occipital scalp scarring. Craniocervical junction maintained. IMPRESSION: Normal noncontrast MRI of the brain. Electronically Signed   By: Awilda Metro M.D.   On: 07/23/2015 23:10   I have personally reviewed and evaluated these images and lab results as part of my medical decision-making.   EKG Interpretation None      MDM   Final diagnoses:  None    She is a 49 year old gentleman with bipolar disorder presenting with suicidal ideation and anxiety. TTS consult will be called. Patient is complaining of increased anxiety. We'll give Ativan 0.5 by mouth. No physical complaints at this time.  Courteney Randall An, MD 07/24/15 [phone]

## 2015-07-24 NOTE — ED Notes (Signed)
Dinner Meal Tray Ordered.

## 2015-07-24 NOTE — BH Assessment (Signed)
Contacted patients nurse to request UDS. Patients nurse states that they are currently waiting on the patient and once he is able he will give one to be collected.   Davina PokeJoVea Rhaya Coale, LCSW Therapeutic Triage Specialist [facility] 07/24/2015 6:13 PM'

## 2015-07-25 ENCOUNTER — Encounter (HOSPITAL_COMMUNITY): Payer: Self-pay | Admitting: Nurse Practitioner

## 2015-07-25 ENCOUNTER — Observation Stay (HOSPITAL_COMMUNITY)
Admission: AD | Admit: 2015-07-25 | Discharge: 2015-07-27 | Disposition: A | Discharge status: Home / Self Care | Payer: Medicare Other | Source: Intra-hospital | Attending: Psychiatry | Admitting: Psychiatry

## 2015-07-25 DIAGNOSIS — F3132 Bipolar disorder, current episode depressed, moderate: Secondary | ICD-10-CM

## 2015-07-25 DIAGNOSIS — F419 Anxiety disorder, unspecified: Secondary | ICD-10-CM | POA: Diagnosis not present

## 2015-07-25 MED ORDER — TRAZODONE HCL 50 MG PO TABS: 50.0000 mg | ORAL_TABLET | Freq: Every evening | ORAL | Status: DC | PRN

## 2015-07-25 MED ORDER — METHOCARBAMOL 500 MG PO TABS
500.0000 mg | ORAL_TABLET | Freq: Four times a day (QID) | ORAL | Status: DC | PRN
  Administered 2015-07-25 – 2015-07-27 (×5): 500 mg via ORAL
  Filled 2015-07-25 (×5): qty 1

## 2015-07-25 MED ORDER — VENLAFAXINE HCL ER 150 MG PO CP24
150.0000 mg | ORAL_CAPSULE | Freq: Every day | ORAL | Status: DC
  Administered 2015-07-26 – 2015-07-27 (×2): 150 mg via ORAL
  Filled 2015-07-25 (×2): qty 1

## 2015-07-25 MED ORDER — PRAZOSIN HCL 1 MG PO CAPS
2.0000 mg | ORAL_CAPSULE | Freq: Every day | ORAL | Status: DC
  Administered 2015-07-25 – 2015-07-26 (×2): 2 mg via ORAL
  Filled 2015-07-25 (×2): qty 2

## 2015-07-25 MED ORDER — ASENAPINE MALEATE 5 MG SL SUBL
5.0000 mg | SUBLINGUAL_TABLET | Freq: Every day | SUBLINGUAL | Status: DC
  Administered 2015-07-25 – 2015-07-26 (×2): 5 mg via SUBLINGUAL
  Filled 2015-07-25 (×5): qty 1

## 2015-07-25 MED ORDER — ACETAMINOPHEN 325 MG PO TABS: 650.0000 mg | ORAL_TABLET | Freq: Four times a day (QID) | ORAL | Status: DC | PRN

## 2015-07-25 MED ORDER — ALUM & MAG HYDROXIDE-SIMETH 200-200-20 MG/5ML PO SUSP
30.0000 mL | ORAL | Status: DC | PRN
  Administered 2015-07-25: 30 mL via ORAL
  Filled 2015-07-25: qty 30

## 2015-07-25 MED ORDER — MAGNESIUM HYDROXIDE 400 MG/5ML PO SUSP: 30.0000 mL | Freq: Every day | ORAL | Status: DC | PRN

## 2015-07-25 MED ORDER — PANTOPRAZOLE SODIUM 40 MG PO TBEC
40.0000 mg | DELAYED_RELEASE_TABLET | Freq: Every day | ORAL | Status: DC
  Administered 2015-07-25 – 2015-07-27 (×3): 40 mg via ORAL
  Filled 2015-07-25 (×3): qty 1

## 2015-07-25 MED ORDER — IBUPROFEN 600 MG PO TABS
600.0000 mg | ORAL_TABLET | Freq: Four times a day (QID) | ORAL | Status: DC | PRN
  Administered 2015-07-25 – 2015-07-27 (×4): 600 mg via ORAL
  Filled 2015-07-25 (×4): qty 1

## 2015-07-25 NOTE — ED Notes (Signed)
Patient stated he usually has a dry mouth and would like some water.  Stated he has never been at a place where they did not supply a small pitcher if water and a cup

## 2015-07-25 NOTE — ED Notes (Signed)
[facility], Marylu LundJanet, Charity fundraiserN.  Waiting on EMTALA to be completed, then will call [facility].

## 2015-07-25 NOTE — ED Notes (Signed)
Patient states he needs something for his tremors.  Hands shaking  Patient alert and following commands

## 2015-07-25 NOTE — Progress Notes (Signed)
   D: Pt was pleasant and cooperative. Pt continues to move his feet intermittently, though he may be snoring. Pt denies si, but complained of acid reflux. Pt also complained of pain. Voiced no other questions or concerns.   A: Pt was given prn mylanta, as well as ibuprofen and robaxin.Support and encouragement was offered. 15 min checks continued for safety.  R: Pt remains safe.
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
## 2015-07-25 NOTE — Progress Notes (Signed)
Accepted to OBS bed 1 per Dr. Lucianne MussKumar. Number for RN to call report is [phone].  Sp[facility] with [facility] re: [facility]. Admission is voluntary and pt can arrive anytime.   Ilean SkillMeghan Jania Steinke, MSW, LCSW Clinical Social Work, Disposition  07/25/2015 [phone]

## 2015-07-25 NOTE — ED Notes (Signed)
Telecart placed in patients room for teleassessment.

## 2015-07-25 NOTE — ED Notes (Addendum)
Scheduled snack times reviewed.  Patient voiced understanding.  Continues to request something to drink
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
## 2015-07-25 NOTE — ED Notes (Addendum)
Patient asked this nurse if he could have a snack.  Stated he was told by the previous nurse to ask when he wanted a [place] sandwich.  Stated he did not receive a 9pm snack.  Also stated that his supper meal was no good and they took it away.  Explained to him that there are scheduled snack times.  This nurse did give him a sandwich and a drink and explained that since the scheduled snack time was missed he would be able to receive something.  Requested to get something for anxiety and "shaking"
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
## 2015-07-26 DIAGNOSIS — F419 Anxiety disorder, unspecified: Secondary | ICD-10-CM | POA: Diagnosis not present

## 2015-07-26 MED ORDER — PANTOPRAZOLE SODIUM 40 MG PO TBEC: 40.0000 mg | DELAYED_RELEASE_TABLET | Freq: Every day | ORAL | Status: DC

## 2015-07-26 MED ORDER — OXCARBAZEPINE 150 MG PO TABS: 150.0000 mg | ORAL_TABLET | Freq: Three times a day (TID) | ORAL | Status: DC

## 2015-07-26 MED ORDER — HYDROXYZINE HCL 50 MG PO TABS
50.0000 mg | ORAL_TABLET | Freq: Three times a day (TID) | ORAL | Status: DC | PRN
  Administered 2015-07-26 – 2015-07-27 (×3): 50 mg via ORAL
  Filled 2015-07-26 (×3): qty 1

## 2015-07-26 MED ORDER — HYDROXYZINE HCL 50 MG PO TABS
50.0000 mg | ORAL_TABLET | Freq: Once | ORAL | Status: AC
  Administered 2015-07-26: 50 mg via ORAL
  Filled 2015-07-26: qty 1

## 2015-07-26 MED ORDER — NICOTINE 21 MG/24HR TD PT24
21.0000 mg | MEDICATED_PATCH | Freq: Every day | TRANSDERMAL | Status: DC
  Administered 2015-07-26 – 2015-07-27 (×2): 21 mg via TRANSDERMAL
  Filled 2015-07-26 (×2): qty 1

## 2015-07-26 MED ORDER — VENLAFAXINE HCL ER 150 MG PO CP24: 150.0000 mg | ORAL_CAPSULE | Freq: Every day | ORAL | Status: DC

## 2015-07-26 MED ORDER — HYDROXYZINE HCL 50 MG PO TABS: 50.0000 mg | ORAL_TABLET | Freq: Three times a day (TID) | ORAL | Status: DC | PRN

## 2015-07-26 MED ORDER — ASENAPINE MALEATE 5 MG SL SUBL: 5.0000 mg | SUBLINGUAL_TABLET | Freq: Every day | SUBLINGUAL | Status: DC

## 2015-07-26 MED ORDER — OXCARBAZEPINE 150 MG PO TABS
150.0000 mg | ORAL_TABLET | Freq: Three times a day (TID) | ORAL | Status: DC
  Administered 2015-07-26 – 2015-07-27 (×4): 150 mg via ORAL
  Filled 2015-07-26 (×4): qty 1

## 2015-07-26 MED ORDER — METHOCARBAMOL 500 MG PO TABS: 500.0000 mg | ORAL_TABLET | Freq: Four times a day (QID) | ORAL | Status: DC | PRN

## 2015-07-26 MED ORDER — PRAZOSIN HCL 2 MG PO CAPS: 2.0000 mg | ORAL_CAPSULE | Freq: Every day | ORAL | Status: DC

## 2015-07-26 MED ORDER — CLONAZEPAM 0.5 MG PO TABS
1.0000 mg | ORAL_TABLET | Freq: Two times a day (BID) | ORAL | Status: AC | PRN
  Administered 2015-07-26 – 2015-07-27 (×2): 1 mg via ORAL
  Filled 2015-07-26 (×2): qty 2

## 2015-07-26 NOTE — Progress Notes (Signed)
D: Pt resting, eyes closed, respirations even and unlabored. No distress noted. A: Continue Q 15 min checks for safety. R: Pt remains safe on the unit.   

## 2015-07-26 NOTE — BHH Counselor (Signed)
Spoke with patient. Pt stated that upon d/c he plans to return to [place] via his own vehicle. Pt. Stated that he receives care out-patinet via psychiatrist in [place] whom he sees 1x per month, but no other therapy. Pt stated that he tried in past and would like to be abel to go [facility] [place] for outpatient helps, and that it has the support group he needs for bipolar as well as other therapy  and groups he believes would be helpful. Pt agreed to sign release of info to have d/c summary and [facility] sent to [facility] for consideration of appointment/ service priority. This Clinical research associatewriter is providing  resources to pt. in [place] for follow up and info for [facility] as well. Vernadette Stutsman K. Sherlon HandingHarris, LCAS-A, LPC-A, [facility]  Counselor 07/26/2015 7:45 PM

## 2015-07-26 NOTE — Progress Notes (Signed)
DAR Nursing Note:  D Patient very anxious, is wanting something to help alleviate it. Patient has visible tremor generalized, states he does       not normally have these tremors, "just when I'm coming down from a mania". Admits to some lingering SI but denies     any HI or AVH; patient does verbally contract for safety. A  Nurse providing therapeutic comfort to patient; did give patient a Robaxyl to try to help alleviate his anxiety as he      does    not have any orders specifically for anxiety. Nurse providing q15 minute checks for safety. R- Patient states the medication helps "a little" but would still like something ordered for anxiety specifically. Patient           remains safe on Unit.

## 2015-07-26 NOTE — Progress Notes (Signed)
[facility]  07/26/2015 5:13 PM Sean Wyatt  MRN:  [id number] Subjective:  Patient continue to feel anxious.  He states that this is the worst he has been when he has missed doses in the past.  [place] in [place] [place] when he started to feel anxious and passively suicidal.  He was driving at the time.    Principal Problem: MDD (major depressive disorder) (H[facility]) Diagnosis:   Patient Active Problem List   Diagnosis Date Noted  . Bipolar mood disorder ([facility]) [F31.9] 07/25/2015  . Depression [F32.9] 07/25/2015  . MDD (major depressive disorder) ([facility]) [F32.9] 07/25/2015   Total Time spent with patient: 30 minutes  Past Psychiatric History: Patient was diagnosed with bipolar mood disorder.    Past Medical History:  Past Medical History  Diagnosis Date  . Kidney stones   . Bipolar 1 disorder ([facility])   . Seizures [facility](HCC)     Past Surgical History  Procedure Laterality Date  . Amputation    . Hernia repair     Family History: History reviewed. No pertinent family history. Family Psychiatric  History:  Bipolar mood disorder Social History:  History  Alcohol Use No     History  Drug Use No    Social History   Social History  . Marital Status: Divorced    Spouse Name: N/A  . Number of Children: N/A  . Years of Education: N/A   Social History Main Topics  . Smoking status: Current Every Day Smoker  . Smokeless tobacco: None  . Alcohol Use: No  . Drug Use: No  . Sexual Activity: Not Asked   Other Topics Concern  . None   Social History Narrative   Additional Social History:     Sleep: Fair  Appetite:  Fair  Current Medications: Current Facility-Administered Medications  Medication Dose Route Frequency Provider Last Rate Last Dose  . acetaminophen (TYLENOL) tablet 650 mg  650 mg Oral Q6H PRN Adonis BrookSheila Omaree Fuqua, NP      . alum & mag hydroxide-simeth (MAALOX/MYLANTA) 200-200-20 MG/5ML suspension 30 mL  30 mL Oral Q4H PRN Adonis BrookSheila Evette Diclemente, NP   30  mL at 07/25/15 2123  . asenapine (SAPHRIS) sublingual tablet 5 mg  5 mg Sublingual QHS Worthy FlankIjeoma E Nwaeze, NP   5 mg at 07/25/15 2210  . hydrOXYzine (ATARAX/VISTARIL) tablet 50 mg  50 mg Oral TID PRN Adonis BrookSheila Raynelle Fujikawa, NP   50 mg at 07/26/15 1426  . ibuprofen (ADVIL,MOTRIN) tablet 600 mg  600 mg Oral Q6H PRN Adonis BrookSheila Makynleigh Breslin, NP   600 mg at 07/25/15 2115  . magnesium hydroxide (MILK OF MAGNESIA) suspension 30 mL  30 mL Oral Daily PRN Adonis BrookSheila Tristian Bouska, NP      . methocarbamol (ROBAXIN) tablet 500 mg  500 mg Oral Q6H PRN Adonis BrookSheila Eileen Croswell, NP   500 mg at 07/26/15 1104  . nicotine (NICODERM CQ - dosed in mg/24 hours) patch 21 mg  21 mg Transdermal Daily Adonis BrookSheila Kynadi Dragos, NP   21 mg at 07/26/15 1702  . OXcarbazepine (TRILEPTAL) tablet 150 mg  150 mg Oral TID Nelly RoutArchana Kumar, MD   150 mg at 07/26/15 1615  . pantoprazole (PROTONIX) EC tablet 40 mg  40 mg Oral Daily Adonis BrookSheila Tremain Rucinski, NP   40 mg at 07/26/15 0839  . prazosin (MINIPRESS) capsule 2 mg  2 mg Oral QHS Worthy FlankIjeoma E Nwaeze, NP   2 mg at 07/25/15 2210  . venlafaxine XR (EFFEXOR-XR) 24 hr capsule 150 mg  150  mg Oral Q breakfast Adonis Brook, NP   150 mg at 07/26/15 1610    Lab Results:  Results for orders placed or performed during the hospital encounter of 07/24/15 (from the past 48 hour(s))  Urine rapid drug screen (hosp performed) (Not at [facility])     Status: Abnormal   Collection Time: 07/24/15  8:30 PM  Result Value Ref Range   Opiates POSITIVE (A) NONE DETECTED   Cocaine NONE DETECTED NONE DETECTED   Benzodiazepines POSITIVE (A) NONE DETECTED   Amphetamines NONE DETECTED NONE DETECTED   Tetrahydrocannabinol NONE DETECTED NONE DETECTED   Barbiturates NONE DETECTED NONE DETECTED    Comment:        DRUG SCREEN FOR MEDICAL PURPOSES ONLY.  IF CONFIRMATION IS NEEDED FOR ANY PURPOSE, NOTIFY LAB WITHIN 5 DAYS.        LOWEST DETECTABLE LIMITS FOR URINE DRUG SCREEN Drug Class       Cutoff (ng/mL) Amphetamine      1000 Barbiturate      200 Benzodiazepine    200 Tricyclics       300 Opiates          300 Cocaine          300 THC              50     Physical Findings: AIMS: Facial and Oral Movements Muscles of Facial Expression: None, normal Lips and Perioral Area: None, normal Jaw: None, normal Tongue: None, normal,Extremity Movements Upper (arms, wrists, hands, fingers): None, normal Lower (legs, knees, ankles, toes): None, normal, Trunk Movements Neck, shoulders, hips: None, normal, Overall Severity Severity of abnormal movements (highest score from questions above): None, normal Incapacitation due to abnormal movements: None, normal Patient's awareness of abnormal movements (rate only patient's report): No Awareness, Dental Status Current problems with teeth and/or dentures?: No Does patient usually wear dentures?: No  CIWA:    COWS:     Musculoskeletal: Strength & Muscle Tone: within normal limits Gait & Station: normal Patient leans: N/A  Psychiatric Specialty Exam: Review of Systems  Neurological: Positive for tremors.  All other systems reviewed and are negative.   Blood pressure 104/65, pulse 79, temperature 98.1 F (36.7 C), temperature source Oral, resp. rate 20, height 6' (1.829 m), weight 92.255 kg (203 lb 6.2 oz), SpO2 96 %.Body mass index is 27.58 kg/(m^2).   General Appearance: Disheveled  Eye Solicitor:: Fair  Speech: Clear and Coherent  Volume: Decreased  Mood: Anxious and Depressed  Affect: Congruent  Thought Process: Circumstantial  Orientation: Full (Time, Place, and Person)  Thought Content: Rumination  Suicidal Thoughts: No  Homicidal Thoughts: No  Memory: Immediate; Good Recent; Good Remote; Good  Judgement: Fair  Insight: Fair  Psychomotor Activity: Normal  Concentration: Good  Recall: Good  Fund of Knowledge:Good  Language: Fair  Akathisia: Negative  Handed: Right  AIMS (if indicated):   Assets: Communication  Skills Resilience  ADL's: Intact  Cognition: WNL   Sleep: fair           Treatment Plan Summary: Daily contact with patient to assess and evaluate symptoms and progress in treatment and Medication management Restarted home med regimen. Discharge in the morning.  Velna Hatchet May Emmit Oriley AGNP-BC 07/26/2015, 5:13 PM

## 2015-07-26 NOTE — Discharge Summary (Signed)
Physician Discharge Summary Note  Patient:  Sean Wyatt is an 49 y.o., male MRN:  [id number] DOB:  05-19-1966 Patient phone:  [phone] (home)  Patient address:   [address] [place] [address],  Total Time spent with patient: 30 minutes  Date of Admission:  07/25/2015 Date of Discharge: 07/27/2015  Reason for Admission:  Depression  Principal Problem: MDD (major depressive disorder) [facility]) Discharge Diagnoses: Patient Active Problem List   Diagnosis Date Noted  . Bipolar mood disorder ([place]) [F31.9] 07/25/2015  . Depression [F32.9] 07/25/2015  . MDD (major depressive disorder) ([place]) [F32.9] 07/25/2015    Musculoskeletal: Strength & Muscle Tone: within normal limits Gait & Station: normal Patient leans: N/A  Psychiatric Specialty Exam: Physical Exam  Vitals reviewed. Psychiatric: His mood appears not anxious. Thought content is not paranoid and not delusional. He expresses no homicidal and no suicidal ideation. He expresses no suicidal plans and no homicidal plans.    Review of Systems  All other systems reviewed and are negative.   Blood pressure 104/65, pulse 79, temperature 98.1 F (36.7 C), temperature source Oral, resp. rate 20, height 6' (1.829 m), weight 92.255 kg (203 lb 6.2 oz), SpO2 96 %.Body mass index is 27.58 kg/(m^2).   General Appearance: Disheveled  Eye Sport and exercise psychologist:: Fair  Speech: Clear and Coherent  Volume: Decreased  Mood: Anxious and Depressed  Affect: Congruent  Thought Process: Circumstantial  Orientation: Full (Time, Place, and Person)  Thought Content: Rumination  Suicidal Thoughts: No  Homicidal Thoughts: No  Memory: Immediate; Good Recent; Good Remote; Good  Judgement: Fair  Insight: Fair  Psychomotor Activity: Normal  Concentration: Good  Recall: Good  Fund of Knowledge:Good  Language: Fair  Akathisia: Negative  Handed: Right  AIMS (if indicated):   Assets:  Communication Skills Resilience  ADL's: Intact  Cognition: WNL   Sleep: fair           Have you used any form of tobacco in the last 30 days? (Cigarettes, Smokeless Tobacco, Cigars, and/or Pipes): Yes  Has this patient used any form of tobacco in the last 30 days? (Cigarettes, Smokeless Tobacco, Cigars, and/or Pipes) N/A  Past Medical History:  Past Medical History  Diagnosis Date  . Kidney stones   . Bipolar 1 disorder ([place])   . Seizures [facility])     Past Surgical History  Procedure Laterality Date  . Amputation    . Hernia repair     Family History: History reviewed. No pertinent family history. Social History:  History  Alcohol Use No     History  Drug Use No    Social History   Social History  . Marital Status: Divorced    Spouse Name: N/A  . Number of Children: N/A  . Years of Education: N/A   Social History Main Topics  . Smoking status: Current Every Day Smoker  . Smokeless tobacco: None  . Alcohol Use: No  . Drug Use: No  . Sexual Activity: Not Asked   Other Topics Concern  . None   Social History Narrative  Risk to Self: Is patient at risk for suicide?: Yes Risk to Others:   Prior Inpatient Therapy:   Prior Outpatient Therapy:    Level of Care:  OP  Hospital Course:  Sean Wyatt came in on his own volition to [facility] after not having had his normal med regimen. He from [place] and is currently in [place] visiting family and friends. He reports that yesterday, he is in car driving when he developed  suicidal ideations. "I was not sure how I was going to do it. But I ran out of meds. Normally, I can go without them for 1 or 2 weeks. This time it had only been 4-5 days and hit me fast." He states that he was diagnosed with bipolar manic depression when he was 10.   Sean Wyatt was admitted for MDD (major depressive disorder) ([place]) and crisis management.  He was treated discharged with the medications listed below under Medication List.   Medical problems were identified and treated as needed.  Home medications were restarted as appropriate.  Improvement was monitored by observation and Sean Wyatt daily report of symptom reduction.  Emotional and mental status was monitored by daily self-inventory reports completed by Sean Wyatt and clinical staff.         Sean Wyatt was evaluated by the treatment team for stability and plans for continued recovery upon discharge.  Sean Wyatt motivation was an integral factor for scheduling further treatment.  Employment, transportation, bed availability, health status, family support, and any pending legal issues were also considered during his hospital stay.  He was offered further treatment options upon discharge including but not limited to Residential, Intensive Outpatient, and Outpatient treatment.  Sean Wyatt will follow up with the services as listed below under Follow Up Information.     Upon completion of this admission the patient was both mentally and medically stable for discharge denying suicidal/homicidal ideation, auditory/visual/tactile hallucinations, delusional thoughts and paranoia.       Consults:  psychiatry  Significant Diagnostic Studies:  labs: per ED  Discharge Vitals:   Blood pressure 104/65, pulse 79, temperature 98.1 F (36.7 C), temperature source Oral, resp. rate 20, height 6' (1.829 m), weight 92.255 kg (203 lb 6.2 oz), SpO2 96 %. Body mass index is 27.58 kg/(m^2). Lab Results:   Results for orders placed or performed during the hospital encounter of 07/24/15 (from the past 72 hour(s))  Comprehensive metabolic panel     Status: Abnormal   Collection Time: 07/24/15  4:15 PM  Result Value Ref Range   Sodium 142 135 - 145 mmol/L   Potassium 3.9 3.5 - 5.1 mmol/L   Chloride 105 101 - 111 mmol/L   CO2 26 22 - 32 mmol/L   Glucose, Bld 127 (H) 65 - 99 mg/dL   BUN <5 (L) 6 - 20 mg/dL   Creatinine, Ser 0.96 0.61 - 1.24 mg/dL   Calcium 9.3 8.9 -  10.3 mg/dL   Total Protein 7.5 6.5 - 8.1 g/dL   Albumin 3.8 3.5 - 5.0 g/dL   AST 53 (H) 15 - 41 U/L   ALT 77 (H) 17 - 63 U/L   Alkaline Phosphatase 54 38 - 126 U/L   Total Bilirubin 0.5 0.3 - 1.2 mg/dL   GFR calc non Af Amer >60 >60 mL/min   GFR calc Af Amer >60 >60 mL/min    Comment: (NOTE) The eGFR has been calculated using the CKD EPI equation. This calculation has not been validated in all clinical situations. eGFR's persistently <60 mL/min signify possible Chronic Kidney Disease.    Anion gap 11 5 - 15  Ethanol (ETOH)     Status: None   Collection Time: 07/24/15  4:15 PM  Result Value Ref Range   Alcohol, Ethyl (B) <5 <5 mg/dL    Comment:        LOWEST DETECTABLE LIMIT FOR SERUM ALCOHOL IS 5 mg/dL FOR MEDICAL PURPOSES ONLY   Salicylate level  Status: None   Collection Time: 07/24/15  4:15 PM  Result Value Ref Range   Salicylate Lvl <7.9 2.8 - 30.0 mg/dL  Acetaminophen level     Status: None   Collection Time: 07/24/15  4:15 PM  Result Value Ref Range   Acetaminophen (Tylenol), Serum 16 10 - 30 ug/mL    Comment:        THERAPEUTIC CONCENTRATIONS VARY SIGNIFICANTLY. A RANGE OF 10-30 ug/mL MAY BE AN EFFECTIVE CONCENTRATION FOR MANY PATIENTS. HOWEVER, SOME ARE BEST TREATED AT CONCENTRATIONS OUTSIDE THIS RANGE. ACETAMINOPHEN CONCENTRATIONS >150 ug/mL AT 4 HOURS AFTER INGESTION AND >50 ug/mL AT 12 HOURS AFTER INGESTION ARE OFTEN ASSOCIATED WITH TOXIC REACTIONS.   CBC     Status: None   Collection Time: 07/24/15  4:15 PM  Result Value Ref Range   WBC 9.1 4.0 - 10.5 K/uL   RBC 4.47 4.22 - 5.81 MIL/uL   Hemoglobin 14.1 13.0 - 17.0 g/dL   HCT 42.3 39.0 - 52.0 %   MCV 94.6 78.0 - 100.0 fL   MCH 31.5 26.0 - 34.0 pg   MCHC 33.3 30.0 - 36.0 g/dL   RDW 12.3 11.5 - 15.5 %   Platelets 256 150 - 400 K/uL  Urine rapid drug screen (hosp performed) (Not at [facility])     Status: Abnormal   Collection Time: 07/24/15  8:30 PM  Result Value Ref Range   Opiates POSITIVE (A)  NONE DETECTED   Cocaine NONE DETECTED NONE DETECTED   Benzodiazepines POSITIVE (A) NONE DETECTED   Amphetamines NONE DETECTED NONE DETECTED   Tetrahydrocannabinol NONE DETECTED NONE DETECTED   Barbiturates NONE DETECTED NONE DETECTED    Comment:        DRUG SCREEN FOR MEDICAL PURPOSES ONLY.  IF CONFIRMATION IS NEEDED FOR ANY PURPOSE, NOTIFY LAB WITHIN 5 DAYS.        LOWEST DETECTABLE LIMITS FOR URINE DRUG SCREEN Drug Class       Cutoff (ng/mL) Amphetamine      1000 Barbiturate      200 Benzodiazepine   892 Tricyclics       119 Opiates          300 Cocaine          300 THC              50     Physical Findings: AIMS: Facial and Oral Movements Muscles of Facial Expression: None, normal Lips and Perioral Area: None, normal Jaw: None, normal Tongue: None, normal,Extremity Movements Upper (arms, wrists, hands, fingers): None, normal Lower (legs, knees, ankles, toes): None, normal, Trunk Movements Neck, shoulders, hips: None, normal, Overall Severity Severity of abnormal movements (highest score from questions above): None, normal Incapacitation due to abnormal movements: None, normal Patient's awareness of abnormal movements (rate only patient's report): No Awareness, Dental Status Current problems with teeth and/or dentures?: No Does patient usually wear dentures?: No  CIWA:    COWS:      See Psychiatric Specialty Exam and Suicide Risk Assessment completed by Attending Physician prior to discharge.  Discharge destination:  Home  Is patient on multiple antipsychotic therapies at discharge:  No   Has Patient had three or more failed trials of antipsychotic monotherapy by history:  No    Recommended Plan for Multiple Antipsychotic Therapies: NA     Medication List    ASK your doctor about these medications      Indication   clonazePAM 1 MG tablet  Commonly known as:  Bobbye [place]  Take 1 mg by mouth 3 (three) times daily as needed for anxiety.      dicyclomine 20  MG tablet  Commonly known as:  BENTYL  Take 20 mg by mouth daily as needed for spasms.      HYDROcodone-acetaminophen 5-325 MG tablet  Commonly known as:  NORCO/VICODIN  Take 1 tablet by mouth every 6 (six) hours as needed for severe pain.      ibuprofen 200 MG tablet  Commonly known as:  ADVIL,MOTRIN  Take 200 mg by mouth every 6 (six) hours as needed for mild pain or moderate pain.      OXcarbazepine 150 MG tablet  Commonly known as:  TRILEPTAL  Take 150 mg by mouth 3 (three) times daily.      prazosin 2 MG capsule  Commonly known as:  MINIPRESS  Take 2 mg by mouth at bedtime.      SAPHRIS 5 MG Subl 24 hr tablet  Generic drug:  asenapine  Place 5 mg under the tongue at bedtime.      venlafaxine XR 150 MG 24 hr capsule  Commonly known as:  EFFEXOR-XR  Take 150 mg by mouth daily with breakfast.          Follow-up recommendations:  Activity:  as tol Diet:  as tol  Comments:  1.  Take all your medications as prescribed.              2.  Report any adverse side effects to outpatient provider.                       3.  Patient instructed to not use alcohol or illegal drugs while on prescription medicines.            4.  In the event of worsening symptoms, instructed patient to call 911, the crisis hotline or go to nearest emergency room for evaluation of symptoms.  Total Discharge Time:  30 min  Signed: Freda Munro May Densil Ottey AGNP-BC 07/26/2015, 5:20 PM

## 2015-07-26 NOTE — Progress Notes (Signed)
Pt alert and cooperative. Affect/ mood depressed and anxious "I want to be less anxious and depressed before I leave, I don't want to get suicidal". Presently denies SI/HI/A/VH. Emotional support and encouragement given. Celene SquibbS. Agustin, NP notified of pt anxiety, new orders received.  Will continue to monitor closely and evaluate for stabilization.

## 2015-07-27 ENCOUNTER — Encounter (HOSPITAL_COMMUNITY): Payer: Self-pay | Admitting: *Deleted

## 2015-07-27 ENCOUNTER — Inpatient Hospital Stay (HOSPITAL_COMMUNITY)
Admission: AD | Admit: 2015-07-27 | Discharge: 2015-08-02 | DRG: 885 | Disposition: A | Discharge status: Home / Self Care | Payer: Medicare Other | Source: Intra-hospital | Attending: Psychiatry | Admitting: Psychiatry

## 2015-07-27 ENCOUNTER — Emergency Department (HOSPITAL_COMMUNITY)
Admission: EM | Admit: 2015-07-27 | Discharge: 2015-07-27 | Disposition: A | Discharge status: Other Acute Inpatient Hospital | Payer: Medicare Other | Source: Home / Self Care | Attending: Emergency Medicine | Admitting: Emergency Medicine

## 2015-07-27 ENCOUNTER — Encounter (HOSPITAL_COMMUNITY): Payer: Self-pay | Admitting: Emergency Medicine

## 2015-07-27 DIAGNOSIS — G8929 Other chronic pain: Secondary | ICD-10-CM | POA: Diagnosis present

## 2015-07-27 DIAGNOSIS — F419 Anxiety disorder, unspecified: Secondary | ICD-10-CM | POA: Diagnosis not present

## 2015-07-27 DIAGNOSIS — F3130 Bipolar disorder, current episode depressed, mild or moderate severity, unspecified: Secondary | ICD-10-CM | POA: Diagnosis present

## 2015-07-27 DIAGNOSIS — R45851 Suicidal ideations: Secondary | ICD-10-CM | POA: Diagnosis present

## 2015-07-27 DIAGNOSIS — F17200 Nicotine dependence, unspecified, uncomplicated: Secondary | ICD-10-CM | POA: Diagnosis present

## 2015-07-27 DIAGNOSIS — M545 Low back pain: Secondary | ICD-10-CM | POA: Diagnosis present

## 2015-07-27 DIAGNOSIS — F32A Depression, unspecified: Secondary | ICD-10-CM

## 2015-07-27 DIAGNOSIS — F329 Major depressive disorder, single episode, unspecified: Secondary | ICD-10-CM

## 2015-07-27 DIAGNOSIS — F332 Major depressive disorder, recurrent severe without psychotic features: Secondary | ICD-10-CM | POA: Diagnosis present

## 2015-07-27 DIAGNOSIS — R4689 Other symptoms and signs involving appearance and behavior: Secondary | ICD-10-CM

## 2015-07-27 DIAGNOSIS — F319 Bipolar disorder, unspecified: Secondary | ICD-10-CM | POA: Diagnosis present

## 2015-07-27 LAB — COMPREHENSIVE METABOLIC PANEL
ALT: 71 U/L — AB (ref 17–63)
AST: 56 U/L — AB (ref 15–41)
Albumin: 4.3 g/dL (ref 3.5–5.0)
Alkaline Phosphatase: 50 U/L (ref 38–126)
Anion gap: 7 (ref 5–15)
BUN: 12 mg/dL (ref 6–20)
CHLORIDE: 104 mmol/L (ref 101–111)
CO2: 28 mmol/L (ref 22–32)
CREATININE: 1 mg/dL (ref 0.61–1.24)
Calcium: 9.1 mg/dL (ref 8.9–10.3)
GFR calc Af Amer: >60 mL/min (ref >60)
GFR calc non Af Amer: >60 mL/min (ref >60)
Glucose, Bld: 90 mg/dL (ref 65–99)
POTASSIUM: 4.3 mmol/L (ref 3.5–5.1)
SODIUM: 139 mmol/L (ref 135–145)
Total Bilirubin: 0.5 mg/dL (ref 0.3–1.2)
Total Protein: 7.7 g/dL (ref 6.5–8.1)

## 2015-07-27 LAB — ACETAMINOPHEN LEVEL: Acetaminophen (Tylenol), Serum: <10 ug/mL — ABNORMAL LOW (ref 10–30)

## 2015-07-27 LAB — CBC
HCT: 42.7 % (ref 39.0–52.0)
HEMOGLOBIN: 14.5 g/dL (ref 13.0–17.0)
MCH: 32.2 pg (ref 26.0–34.0)
MCHC: 34 g/dL (ref 30.0–36.0)
MCV: 94.9 fL (ref 78.0–100.0)
Platelets: 247 10*3/uL (ref 150–400)
RBC: 4.5 MIL/uL (ref 4.22–5.81)
RDW: 12.1 % (ref 11.5–15.5)
WBC: 13.3 10*3/uL — AB (ref 4.0–10.5)

## 2015-07-27 LAB — SALICYLATE LEVEL

## 2015-07-27 LAB — ETHANOL: Alcohol, Ethyl (B): <5 mg/dL (ref <5)

## 2015-07-27 MED ORDER — ALUM & MAG HYDROXIDE-SIMETH 200-200-20 MG/5ML PO SUSP: 30.0000 mL | ORAL | Status: DC | PRN

## 2015-07-27 MED ORDER — ACETAMINOPHEN 325 MG PO TABS
650.0000 mg | ORAL_TABLET | Freq: Four times a day (QID) | ORAL | Status: DC | PRN
  Administered 2015-07-28 – 2015-07-29 (×2): 650 mg via ORAL
  Filled 2015-07-27 (×2): qty 2

## 2015-07-27 MED ORDER — VENLAFAXINE HCL ER 150 MG PO CP24
150.0000 mg | ORAL_CAPSULE | Freq: Every day | ORAL | Status: DC
  Administered 2015-07-28: 150 mg via ORAL
  Filled 2015-07-27 (×4): qty 1

## 2015-07-27 MED ORDER — LORAZEPAM 1 MG PO TABS
1.0000 mg | ORAL_TABLET | Freq: Once | ORAL | Status: AC
  Administered 2015-07-27: 1 mg via ORAL
  Filled 2015-07-27: qty 1

## 2015-07-27 MED ORDER — METHOCARBAMOL 500 MG PO TABS
500.0000 mg | ORAL_TABLET | Freq: Four times a day (QID) | ORAL | Status: DC | PRN
  Administered 2015-07-27 – 2015-07-28 (×2): 500 mg via ORAL
  Filled 2015-07-27 (×2): qty 1

## 2015-07-27 MED ORDER — MAGNESIUM HYDROXIDE 400 MG/5ML PO SUSP: 30.0000 mL | Freq: Every day | ORAL | Status: DC | PRN

## 2015-07-27 MED ORDER — PRAZOSIN HCL 2 MG PO CAPS
2.0000 mg | ORAL_CAPSULE | Freq: Every day | ORAL | Status: DC
  Filled 2015-07-27: qty 1
  Filled 2015-07-27: qty 2
  Filled 2015-07-27 (×2): qty 1

## 2015-07-27 MED ORDER — NICOTINE 21 MG/24HR TD PT24
21.0000 mg | MEDICATED_PATCH | Freq: Every day | TRANSDERMAL | Status: DC
  Administered 2015-07-28 – 2015-08-02 (×6): 21 mg via TRANSDERMAL
  Filled 2015-07-27 (×8): qty 1

## 2015-07-27 MED ORDER — ASENAPINE MALEATE 5 MG SL SUBL
5.0000 mg | SUBLINGUAL_TABLET | Freq: Every day | SUBLINGUAL | Status: DC
  Administered 2015-07-27: 5 mg via SUBLINGUAL
  Filled 2015-07-27 (×4): qty 1

## 2015-07-27 MED ORDER — OXCARBAZEPINE 150 MG PO TABS
150.0000 mg | ORAL_TABLET | Freq: Three times a day (TID) | ORAL | Status: DC
  Administered 2015-07-28 – 2015-08-02 (×16): 150 mg via ORAL
  Filled 2015-07-27 (×20): qty 1

## 2015-07-27 MED ORDER — PANTOPRAZOLE SODIUM 40 MG PO TBEC
40.0000 mg | DELAYED_RELEASE_TABLET | Freq: Every day | ORAL | Status: DC
  Administered 2015-07-28 – 2015-08-02 (×6): 40 mg via ORAL
  Filled 2015-07-27 (×8): qty 1

## 2015-07-27 NOTE — Discharge Instructions (Addendum)
Patient stated that he is feeling much better this date and is ready to be discharged denying any H/I or S/I. Patient stated that he had ran out of his medications approximately four days ago and was feeling very manic upon admission. Patient has been started back on his medication regimen and will be discharged with prescriptions allowing him to maintain his current regimen. Patient has transportation and was in route to his residence in [place] when this episode occurred. Patient stated that he has adequate funds to allow him to obtain his medications and resume his trip. This Clinical research associatewriter and Renata Capriceonrad NP discussed with patient, a safety plan if patient started to feel anxious from being in a different environment with patient stating that he had no thoughts of self harm denying any S/I or H/I at the time of discharge. Patient was given information in reference to resources in the area and was released with a treatment and safety plan.

## 2015-07-27 NOTE — BH Assessment (Addendum)
Received notification of TTS consult request. Spoke to Cathren LaineKevin Steinl, MD who said Pt [facility] and says he is suicidal. Assessment will be initiated.  Harlin RainFord Ellis Patsy BaltimoreWarrick Jr, LPC, [facility], [facility] Triage Specialist [phone]

## 2015-07-27 NOTE — ED Notes (Signed)
dealy on lab draw edp in room

## 2015-07-27 NOTE — Progress Notes (Signed)
Pt admitted to Adult Unit with Bipolar D/O. Pt presents alert and cooperative. +SI w/plan to drive car off bridge. Pt reports living in [place] but came to [place] on vacation "I came to see family in [place] and [place], I got depressed heading back home, I was manic". -HI/A/V/H. "I get real depressed normally after a manic episode".  Pt reports worrying about his health and daughter who is pregnant. Pt also reports symptoms including social withdrawal, loss of interest in usual pleasures, decreased concentration, decreased appetite, sleep disturbance and feelings of hopelessness. Emotional support and encouragement given. Pt has left [facility] and wears a prosthesis. Will monitor closely and evaluate for stabilization.

## 2015-07-27 NOTE — Progress Notes (Signed)
Nursing Discharge Notes:  Patient states he is having no SI/HI/AVH but does admit to continuing relatively high anxiety. Nurse has medicated patient for anxiety with all available prn medications thus far this shift with "a little bit" of relief per patient. Patient's belongings returned to patient and signed for, discharge instructions discussed and signed, prescriptions also given to patient.  Patient d/c/d from Unit ambulatory from Unit, [facility].

## 2015-07-27 NOTE — ED Notes (Addendum)
Pt arrived to the ED with a complaint of suicidal ideation.  Pt states he wants to drive his car into something.  [facility] but had told them he was not ready to leave.  Pt is from [place] and is here on vacation.  Pt states he is scared to be alone for fear that he will act on his suicidal ideations.  Pt has a prosthetic left leg

## 2015-07-27 NOTE — Discharge Instructions (Signed)
Transfer to [facility] 

## 2015-07-27 NOTE — Tx Team (Signed)
Initial Interdisciplinary Treatment Plan   PATIENT STRESSORS: Health problems Medication change or noncompliance   PATIENT STRENGTHS: Ability for insight Average or above average intelligence Capable of independent living Communication skills Motivation for treatment/growth Supportive family/friends   PROBLEM LIST: Problem List/Patient Goals Date to be addressed Date deferred Reason deferred Estimated date of resolution   Depression "get stabilized and learn more coping methods" 07/27/15   At d/c   Anxiety "get my anxiety under control" 07/27/15   At d/c  Suicidal ideation 07/27/15   At d/c                                       DISCHARGE CRITERIA:  Improved stabilization in mood, thinking, and/or behavior Motivation to continue treatment in a less acute level of care Need for constant or close observation no longer present Verbal commitment to aftercare and medication compliance  PRELIMINARY DISCHARGE PLAN: Outpatient therapy Return to previous living arrangement  PATIENT/FAMIILY INVOLVEMENT: This treatment plan has been presented to and reviewed with the patient, Sean Wyatt.  The patient and family have been given the opportunity to ask questions and make suggestions.  Sean Wyatt, Sean Wyatt 07/27/2015, 10:44 PM

## 2015-07-27 NOTE — BH Assessment (Signed)
[facility] Assessment Progress Note  Patient stated that he is feeling much better this date and is ready to be discharged denying any H/I or S/I. Patient stated that he had ran out of his medications approximately four days ago and was feeling very manic upon admission. Patient has started back on his medication regimen and has returned to his baseline this date denying any thoughts of self harm. Patient will be discharged with prescriptions allowing him to maintain his current regimen and stated he will have them filled upon discharge. Patient has transportation and was in route to his residence in [place] when this episode occurred. Patient stated that he has adequate funds to allow him to obtain his medications and resume his trip upon discharge. This Clinical research associatewriter and Renata Capriceonrad NP discussed with patient, a safety plan if patient started to feel anxious from being in a different environment with patient stating that he had no thoughts of self harm denying any S/I or H/I at the time of discharge. Patient was given information in reference to resources in the area and was released with a treatment and safety plan.
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
## 2015-07-27 NOTE — ED Provider Notes (Addendum)
CSN: [id number]     Arrival date & time 07/27/15  1923 History   First MD Initiated Contact with Patient 07/27/15 1937     Chief Complaint  Patient presents with  . Suicidal     (Consider location/radiation/quality/duration/timing/severity/associated sxs/prior Treatment) The history is provided by the patient.  Patient w hx bipolar disorder, states 'I'm still feeling depressed and suicidal', after being released from behavioral health hospital this AM.  Pt indicates he told them he wasn't ready to be released, although records indicate pt was feeling improved upon d/c earlier today.  Pt was given prescriptions for his medications, but hasnt yet filled. Patient gives hx that he was on his way from vacationing w family in [place], and on way back to [place] where he lives, when he stopped in [place] feeling depressed. States he had been off his meds for a few days. When asked what he had been doing between d/c at/around noon and tonight, pt indicates he was just checking out some shops in [place]. Normal appetite. No trouble sleeping at night. No wt loss. Denies new stressor or inciting event. Denies physical c/o. Denies substance abuse.       Past Medical History  Diagnosis Date  . Kidney stones   . Bipolar 1 disorder (HCC)   . Seizures [facility])    Past Surgical History  Procedure Laterality Date  . Amputation    . Hernia repair     History reviewed. No pertinent family history. Social History  Substance Use Topics  . Smoking status: Current Every Day Smoker  . Smokeless tobacco: None  . Alcohol Use: No    Review of Systems  Constitutional: Negative for fever and chills.  HENT: Negative for sore throat.   Eyes: Negative for redness.  Respiratory: Negative for shortness of breath.   Cardiovascular: Negative for chest pain.  Gastrointestinal: Negative for vomiting, abdominal pain and diarrhea.  Genitourinary: Negative for flank pain.  Musculoskeletal: Negative for back  pain and neck pain.  Skin: Negative for rash.  Neurological: Negative for headaches.  Hematological: Does not bruise/bleed easily.  Psychiatric/Behavioral: Positive for suicidal ideas and dysphoric mood.      Allergies  Clindamycin/lincomycin; Seroquel; and Trazamine  Home Medications   Prior to Admission medications   Medication Sig Start Date End Date Taking? Authorizing Provider  asenapine (SAPHRIS) 5 MG SUBL 24 hr tablet Place 1 tablet (5 mg total) under the tongue at bedtime. 07/26/15   Adonis Brook, NP  hydrOXYzine (ATARAX/VISTARIL) 50 MG tablet Take 1 tablet (50 mg total) by mouth 3 (three) times daily as needed for anxiety. 07/26/15   Adonis Brook, NP  methocarbamol (ROBAXIN) 500 MG tablet Take 1 tablet (500 mg total) by mouth every 6 (six) hours as needed for muscle spasms. 07/26/15   Adonis Brook, NP  OXcarbazepine (TRILEPTAL) 150 MG tablet Take 1 tablet (150 mg total) by mouth 3 (three) times daily. 07/26/15   Adonis Brook, NP  pantoprazole (PROTONIX) 40 MG tablet Take 1 tablet (40 mg total) by mouth daily. 07/26/15   Adonis Brook, NP  prazosin (MINIPRESS) 2 MG capsule Take 1 capsule (2 mg total) by mouth at bedtime. 07/26/15   Adonis Brook, NP  venlafaxine XR (EFFEXOR-XR) 150 MG 24 hr capsule Take 1 capsule (150 mg total) by mouth daily with breakfast. 07/26/15   Adonis Brook, NP   BP 161/85 mmHg  Pulse 94  Temp(Src) 98 F (36.7 C) (Oral)  Resp 18  SpO2 98% Physical Exam  Constitutional:  He is oriented to person, place, and time. He appears well-developed and well-nourished. No distress.  HENT:  Head: Atraumatic.  Eyes: Conjunctivae are normal. Pupils are equal, round, and reactive to light. No scleral icterus.  Neck: Neck supple. No tracheal deviation present.  Cardiovascular: Normal rate, regular rhythm, normal heart sounds and intact distal pulses.   Pulmonary/Chest: Effort normal and breath sounds normal. No accessory muscle usage. No respiratory  distress.  Abdominal: Soft. Bowel sounds are normal. He exhibits no distension. There is no tenderness.  Musculoskeletal: Normal range of motion. He exhibits no edema.  Neurological: He is alert and oriented to person, place, and time.  Stead gait.   Skin: Skin is warm and dry.  Psychiatric:  Pt very conversant, and with odd affect - when describing feeling depressed with suicidal thoughts, pt appears to have normal mood, normal affect, and does not appear acutely depressed.   Nursing note and vitals reviewed.   ED Course  Procedures (including critical care time) Labs Review  Results for orders placed or performed during the hospital encounter of 07/27/15  Comprehensive metabolic panel  Result Value Ref Range   Sodium 139 135 - 145 mmol/L   Potassium 4.3 3.5 - 5.1 mmol/L   Chloride 104 101 - 111 mmol/L   CO2 28 22 - 32 mmol/L   Glucose, Bld 90 65 - 99 mg/dL   BUN 12 6 - 20 mg/dL   Creatinine, Ser 1.611.00 0.61 - 1.24 mg/dL   Calcium 9.1 8.9 - 09.610.3 mg/dL   Total Protein 7.7 6.5 - 8.1 g/dL   Albumin 4.3 3.5 - 5.0 g/dL   AST 56 (H) 15 - 41 U/L   ALT 71 (H) 17 - 63 U/L   Alkaline Phosphatase 50 38 - 126 U/L   Total Bilirubin 0.5 0.3 - 1.2 mg/dL   GFR calc non Af Amer >60 >60 mL/min   GFR calc Af Amer >60 >60 mL/min   Anion gap 7 5 - 15  Ethanol (ETOH)  Result Value Ref Range   Alcohol, Ethyl (B) <5 <5 mg/dL  Salicylate level  Result Value Ref Range   Salicylate Lvl <4.0 2.8 - 30.0 mg/dL  Acetaminophen level  Result Value Ref Range   Acetaminophen (Tylenol), Serum <10 (L) 10 - 30 ug/mL  CBC  Result Value Ref Range   WBC 13.3 (H) 4.0 - 10.5 K/uL   RBC 4.50 4.22 - 5.81 MIL/uL   Hemoglobin 14.5 13.0 - 17.0 g/dL   HCT 04.542.7 40.939.0 - 81.152.0 %   MCV 94.9 78.0 - 100.0 fL   MCH 32.2 26.0 - 34.0 pg   MCHC 34.0 30.0 - 36.0 g/dL   RDW 91.412.1 78.211.5 - 95.615.5 %   Platelets 247 150 - 400 K/uL       I have personally reviewed and evaluated these lab results as part of my medical  decision-making.   MDM   Psych team consulted.  Pt requests med for nerves, takes klonopin.  Ativan 1 mg po.   Reviewed nursing notes and prior charts for additional history.   Recheck calm and alert.   Psych eval pending.  Disposition per psych team.  [facility] Dr Jama Flavorsobos, bed ready.      Cathren LaineKevin Jackelin Correia, MD 07/27/15 2101

## 2015-07-27 NOTE — Progress Notes (Signed)
Psychoeducational Group Note  Date:  07/27/2015 Time:  2229  Group Topic/Focus:  Wrap-Up Group:   The focus of this group is to help patients review their daily goal of treatment and discuss progress on daily workbooks.  Participation Level: Did Not Attend  Participation Quality:  Not Applicable  Affect:  Not Applicable  Cognitive:  Not Applicable  Insight:  Not Applicable  Engagement in Group: Not Applicable  Additional Comments:  The patient did not attend group since he had yet to be admitted to the hallway.   Hazle CocaGOODMAN, Liem Copenhaver S 07/27/2015, 10:28 PM

## 2015-07-28 ENCOUNTER — Encounter (HOSPITAL_COMMUNITY): Payer: Self-pay | Admitting: Psychiatry

## 2015-07-28 DIAGNOSIS — R45851 Suicidal ideations: Secondary | ICD-10-CM

## 2015-07-28 DIAGNOSIS — F3130 Bipolar disorder, current episode depressed, mild or moderate severity, unspecified: Secondary | ICD-10-CM | POA: Diagnosis present

## 2015-07-28 LAB — RAPID URINE DRUG SCREEN, HOSP PERFORMED
Amphetamines: NOT DETECTED
BARBITURATES: NOT DETECTED
BENZODIAZEPINES: POSITIVE — AB
COCAINE: NOT DETECTED
OPIATES: NOT DETECTED
Tetrahydrocannabinol: NOT DETECTED

## 2015-07-28 MED ORDER — LIDOCAINE 5 % EX PTCH
1.0000 | MEDICATED_PATCH | CUTANEOUS | Status: DC
  Filled 2015-07-28 (×6): qty 1

## 2015-07-28 MED ORDER — MELOXICAM 7.5 MG PO TABS
7.5000 mg | ORAL_TABLET | Freq: Every day | ORAL | Status: DC
  Administered 2015-07-28 – 2015-07-30 (×3): 7.5 mg via ORAL
  Filled 2015-07-28 (×6): qty 1

## 2015-07-28 MED ORDER — VENLAFAXINE HCL ER 75 MG PO CP24
225.0000 mg | ORAL_CAPSULE | Freq: Every day | ORAL | Status: DC
  Administered 2015-07-29 – 2015-08-02 (×5): 225 mg via ORAL
  Filled 2015-07-28 (×6): qty 1

## 2015-07-28 MED ORDER — HYDROXYZINE HCL 50 MG PO TABS
50.0000 mg | ORAL_TABLET | Freq: Four times a day (QID) | ORAL | Status: DC | PRN
  Administered 2015-07-29 – 2015-07-30 (×2): 50 mg via ORAL
  Filled 2015-07-28 (×2): qty 1

## 2015-07-28 MED ORDER — METHOCARBAMOL 750 MG PO TABS
750.0000 mg | ORAL_TABLET | Freq: Four times a day (QID) | ORAL | Status: DC | PRN
  Administered 2015-07-28 – 2015-07-31 (×9): 750 mg via ORAL
  Filled 2015-07-28 (×9): qty 1

## 2015-07-28 MED ORDER — PRAZOSIN HCL 2 MG PO CAPS
2.0000 mg | ORAL_CAPSULE | Freq: Every day | ORAL | Status: DC
  Administered 2015-07-28 – 2015-08-02 (×6): 2 mg via ORAL
  Filled 2015-07-28 (×6): qty 1
  Filled 2015-07-28: qty 2
  Filled 2015-07-28: qty 1

## 2015-07-28 MED ORDER — HYDROXYZINE HCL 25 MG PO TABS
25.0000 mg | ORAL_TABLET | Freq: Four times a day (QID) | ORAL | Status: DC | PRN
  Administered 2015-07-28: 25 mg via ORAL
  Filled 2015-07-28: qty 1

## 2015-07-28 MED ORDER — ASENAPINE MALEATE 5 MG SL SUBL
10.0000 mg | SUBLINGUAL_TABLET | Freq: Every day | SUBLINGUAL | Status: DC
  Administered 2015-07-28 – 2015-08-01 (×5): 10 mg via SUBLINGUAL
  Filled 2015-07-28: qty 2
  Filled 2015-07-28: qty 1
  Filled 2015-07-28 (×5): qty 2

## 2015-07-28 MED ORDER — CLONAZEPAM 1 MG PO TABS
1.0000 mg | ORAL_TABLET | Freq: Three times a day (TID) | ORAL | Status: DC | PRN
  Administered 2015-07-28 – 2015-07-31 (×9): 1 mg via ORAL
  Filled 2015-07-28 (×9): qty 1

## 2015-07-28 NOTE — Progress Notes (Signed)
D: Pt continues to be very flat and depressed on the unit today. Pt has been visible in milieu interacting with peers and staff. Pt complained of back pain and anxiety, pt medicated as ordered. Pt attended group and actively participated. Pt has been cooperative with taking his medication and unit rules. Pt reported that his depression was a 6, his hopelessness was a 7, and that his anxiety was a 6. Pt reported being negative SI/HI, no AH/VH noted. A: 15 min checks continued for patient safety. R: Pts safety maintained.
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
## 2015-07-28 NOTE — Plan of Care (Signed)
Problem: Ineffective individual coping Goal: STG: Patient will remain free from self harm Outcome: Progressing Patient has remained free from self harm, safety maintained with 15 min checks.     

## 2015-07-28 NOTE — [facility] Group Notes (Signed)
[facility] Group Notes:  (Nursing/MHT/Case Management/Adjunct)  Date:  07/28/2015  Time:  3:33 PM  Type of Therapy:  Psychoeducational Skills  Participation Level:  Active  Participation Quality:  Appropriate  Affect:  Appropriate  Cognitive:  Appropriate  Insight:  Appropriate  Engagement in Group:  Engaged  Modes of Intervention:  Discussion  Summary of Progress/Problems: Pt sated that one of his coping skills is to exercise and walk the dog.   Bethann PunchesJane O Merlene Dante 07/28/2015, 3:33 PM

## 2015-07-28 NOTE — Progress Notes (Signed)
[facility] Group Notes:  (Nursing/MHT/Case Management/Adjunct)  Date:  07/28/2015  Time:  11:20 PM  Type of Therapy:  Psychoeducational Skills  Participation Level:  Minimal  Participation Quality:  Attentive  Affect:  Depressed and Flat  Cognitive:  Lacking  Insight:  Limited  Engagement in Group:  Limited  Modes of Intervention:  Education  Summary of Progress/Problems: The patient had little to share except that he slept for nearly the entire day. No additional details were provided about his day despite this author's prompting. As for the theme of the day, his support system will consist of his sister, father, and children.   Sean Wyatt S 07/28/2015, 11:20 PM

## 2015-07-28 NOTE — [facility] Group Notes (Signed)
[facility] Group Notes:  (Nursing/MHT/Case Management/Adjunct)  Date:  07/28/2015  Time:  3:52 PM  Type of Therapy:  Nurse Education  /  Healthy support systems: The group is focused on teaching pts healthy coping  skilss.  Participation Level:  Did Not Attend  Participation Quality:    Affect:    Cognitive:    Insight:    Engagement in Group:    Modes of Intervention:    Summary of Progress/Problems:  Rich BraveDuke, Ahlia Lemanski Lynn 07/28/2015, 3:52 PM

## 2015-07-28 NOTE — [facility] Suicide Risk Assessment (Signed)
[facility] Family/Significant Other [facility]icide [facility]  Suicide Prevention Education:   Patient Refusal for Family/Significant Other Suicide Prevention Education: The patient has refused to provide written consent for family/significant other to be provided Family/Significant Other Suicide Prevention Education during admission and/or prior to discharge.  Physician notified.  CSW provided suicide prevention information with patient.    The suicide prevention education provided includes the following:  Suicide risk factors  Suicide prevention and interventions  National Suicide Hotline telephone number  [facility] assessment telephone number  [facility] Emergency Assistance 911  [facility] and/or [facility]idential Mobile Crisis Unit telephone number   Reyes IvanChelsea Horton, [place] 07/28/2015 10:02 AM

## 2015-07-28 NOTE — [facility] Group Notes (Signed)
[facility] Group Notes: (Clinical Social Work)   07/28/2015      Type of Therapy:  Group Therapy   Participation Level:  Did Not Attend despite MHT prompting   Toniyah Dilmore Grossman-Orr, LCSW 07/28/2015, 3:09 PM     

## 2015-07-28 NOTE — Progress Notes (Signed)
Writer observed patient sitting in the dayroom watching tv with non interactions with peers. Writer spoke with him 1:1 and he reports that he slept a lot today and was able to attend 1 group today. He reports that while here he plans to work on his anxiety and hopefully manage his bipolar better. He reports that the doctor is working with his medications and he is hopeful that it will help. He was informed of his increase in his saphris and was aware of the change. He currently denies is/hi/a/v hallucinations. Support given and safety maintained on unit with 15 min checks.
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
## 2015-07-28 NOTE — H&P (Signed)
Psychiatric Admission Assessment Adult  Patient Identification: Sean Wyatt MRN:  [id number] Date of Evaluation:  07/28/2015 Chief Complaint:  BIPOLAR DISORDER PTSD Principal Diagnosis: MDD (major depressive disorder), recurrent severe, without psychosis ([place]) Diagnosis:   Patient Active Problem List   Diagnosis Date Noted  . MDD (major depressive disorder), recurrent severe, without psychosis ([place]) [F33.2] 07/28/2015    Priority: High  . Bipolar mood disorder ([place]) [F31.9] 07/25/2015  . Depression [F32.9] 07/25/2015  . MDD (major depressive disorder) ([place]) [F32.9] 07/25/2015   History of Present Illness: Per ED notes: Patient w hx bipolar disorder, states 'I'm still feeling depressed and suicidal', after being released from behavioral health hospital this AM. Pt indicates he told them he wasn't ready to be released, although records indicate pt was feeling improved upon d/c earlier today. Pt was given prescriptions for his medications, but hasnt yet filled. Patient gives hx that he was on his way from [place] w family in [place], and on way back to [place] where he lives, when he stopped in [place] feeling depressed. States he had been off his meds for a few days. When asked what he had been doing between d/c at/around noon and tonight, pt indicates he was just checking out some shops in [place]. Normal appetite. No trouble sleeping at night. No wt loss. Denies new stressor or inciting event. Denies physical c/o. Denies substance abuse.   Today, pt seen and chart reviewed for H&P on 07/28/15: Pt is alert/oriented x4, depressed, cooperative, and appropriate. Pt denies suicidal/homicidal ideation and psychosis and does not appear to be responding to internal stimuli. Pt cites good sleep with his Saphris last night. Moderate yet improving appetite. Cites participation in groups and benefit from such.   Pt reports leaving the OBS Unit 2 days ago (I discharged him) and that he  became suicidal. Pt is visibly depressed and anxious. Denies substance abuse and ETOH. Pt willing to participate in treatment plan and medication titration. Is asking for medication increases and complaining of chronic lower back pain.   HPI Elements: Location: Bipolar, depressed. Quality: non compliance with meds. Timing: yesterday. Context: see HPI.  Associated Signs/Symptoms: Depression Symptoms:  depressed mood, fatigue, hopelessness, (Hypo) Manic Symptoms:  Impulsivity, Labiality of Mood, Anxiety Symptoms:  Excessive Worry, Psychotic Symptoms:  NA PTSD Symptoms: NA Total Time spent with patient: 45 minutes  Past Psychiatric History:  Patient has had several mental health inpatient admissions in [place]. Left the [facility] O[facility] 2 days ago  Risk to Self: Is patient at risk for suicide?: Yes Risk to Others:   Prior Inpatient Therapy:   Prior Outpatient Therapy:    Alcohol Screening: 1. How often do you have a drink containing alcohol?: Monthly or less 2. How many drinks containing alcohol do you have on a typical day when you are drinking?: 1 or 2 3. How often do you have six or more drinks on one occasion?: Never Preliminary Score: 0 4. How often during the last year have you found that you were not able to stop drinking once you had started?: Never 5. How often during the last year have you failed to do what was normally expected from you becasue of drinking?: Never 6. How often during the last year have you needed a first drink in the morning to get yourself going after a heavy drinking session?: Never 7. How often during the last year have you had a feeling of guilt of remorse after drinking?: Never 8. How often during the last  year have you been unable to remember what happened the night before because you had been drinking?: Never 9. Have you or someone else been injured as a result of your drinking?: No 10. Has a relative or friend or a doctor or another health worker been  concerned about your drinking or suggested you cut down?: No Alcohol Use Disorder Identification Test Final Score (AUDIT): 1 Brief Intervention: AUDIT score less than 7 or less-screening does not suggest unhealthy drinking-brief intervention not indicated Substance Abuse History in the last 12 months:  Yes.   Consequences of Substance Abuse: inpatient crisis stabilization hx of Previous Psychotropic Medications: Yes  Psychological Evaluations: Yes  Past Medical History:  Past Medical History  Diagnosis Date  . Kidney stones   . Bipolar 1 disorder ([place])   . Seizures [facility])     Past Surgical History  Procedure Laterality Date  . Amputation    . Hernia repair     Family History: History reviewed. No pertinent family history. Family Psychiatric  History: denies Social History:  History  Alcohol Use No     History  Drug Use No    Social History   Social History  . Marital Status: Divorced    Spouse Name: N/A  . Number of Children: N/A  . Years of Education: N/A   Social History Main Topics  . Smoking status: Current Every Day Smoker  . Smokeless tobacco: None  . Alcohol Use: No  . Drug Use: No  . Sexual Activity: Not Currently   Other Topics Concern  . None   Social History Narrative   Additional Social History:  Allergies:   Allergies  Allergen Reactions  . Clindamycin/Lincomycin Diarrhea and Nausea And Vomiting  . Gabapentin Other (See Comments)    Tremors   . Seroquel [Quetiapine] Other (See Comments)    "Makes me climb the walls"   . Trazamine [Trazodone & Diet Manage Prod] Other (See Comments)    "Makes me climb the walls"    Lab Results:  Results for orders placed or performed during the hospital encounter of 07/27/15 (from the past 48 hour(s))  Comprehensive metabolic panel     Status: Abnormal   Collection Time: 07/27/15  8:00 PM  Result Value Ref Range   Sodium 139 135 - 145 mmol/L   Potassium 4.3 3.5 - 5.1 mmol/L   Chloride 104 101 - 111 mmol/L    CO2 28 22 - 32 mmol/L   Glucose, Bld 90 65 - 99 mg/dL   BUN 12 6 - 20 mg/dL   Creatinine, Ser 1.00 0.61 - 1.24 mg/dL   Calcium 9.1 8.9 - 10.3 mg/dL   Total Protein 7.7 6.5 - 8.1 g/dL   Albumin 4.3 3.5 - 5.0 g/dL   AST 56 (H) 15 - 41 U/L   ALT 71 (H) 17 - 63 U/L   Alkaline Phosphatase 50 38 - 126 U/L   Total Bilirubin 0.5 0.3 - 1.2 mg/dL   GFR calc non Af Amer >60 >60 mL/min   GFR calc Af Amer >60 >60 mL/min    Comment: (NOTE) The eGFR has been calculated using the CKD EPI equation. This calculation has not been validated in all clinical situations. eGFR's persistently <60 mL/min signify possible Chronic Kidney Disease.    Anion gap 7 5 - 15  Ethanol (ETOH)     Status: None   Collection Time: 07/27/15  8:00 PM  Result Value Ref Range   Alcohol, Ethyl (B) <5 <5 mg/dL  Comment:        LOWEST DETECTABLE LIMIT FOR SERUM ALCOHOL IS 5 mg/dL FOR MEDICAL PURPOSES ONLY   Salicylate level     Status: None   Collection Time: 07/27/15  8:00 PM  Result Value Ref Range   Salicylate Lvl <3.7 2.8 - 30.0 mg/dL  Acetaminophen level     Status: Abnormal   Collection Time: 07/27/15  8:00 PM  Result Value Ref Range   Acetaminophen (Tylenol), Serum <10 (L) 10 - 30 ug/mL    Comment:        THERAPEUTIC CONCENTRATIONS VARY SIGNIFICANTLY. A RANGE OF 10-30 ug/mL MAY BE AN EFFECTIVE CONCENTRATION FOR MANY PATIENTS. HOWEVER, SOME ARE BEST TREATED AT CONCENTRATIONS OUTSIDE THIS RANGE. ACETAMINOPHEN CONCENTRATIONS >150 ug/mL AT 4 HOURS AFTER INGESTION AND >50 ug/mL AT 12 HOURS AFTER INGESTION ARE OFTEN ASSOCIATED WITH TOXIC REACTIONS.   CBC     Status: Abnormal   Collection Time: 07/27/15  8:00 PM  Result Value Ref Range   WBC 13.3 (H) 4.0 - 10.5 K/uL   RBC 4.50 4.22 - 5.81 MIL/uL   Hemoglobin 14.5 13.0 - 17.0 g/dL   HCT 42.7 39.0 - 52.0 %   MCV 94.9 78.0 - 100.0 fL   MCH 32.2 26.0 - 34.0 pg   MCHC 34.0 30.0 - 36.0 g/dL   RDW 12.1 11.5 - 15.5 %   Platelets 247 150 - 943 K/uL     Metabolic Disorder Labs:  No results found for: HGBA1C, MPG No results found for: PROLACTIN No results found for: CHOL, TRIG, HDL, CHOLHDL, VLDL, LDLCALC  Current Medications: Current Facility-Administered Medications  Medication Dose Route Frequency Provider Last Rate Last Dose  . acetaminophen (TYLENOL) tablet 650 mg  650 mg Oral Q6H PRN Lurena Nida, NP      . alum & mag hydroxide-simeth (MAALOX/MYLANTA) 200-200-20 MG/5ML suspension 30 mL  30 mL Oral Q4H PRN Lurena Nida, NP      . asenapine (SAPHRIS) sublingual tablet 5 mg  5 mg Sublingual QHS Lurena Nida, NP   5 mg at 07/27/15 2324  . magnesium hydroxide (MILK OF MAGNESIA) suspension 30 mL  30 mL Oral Daily PRN Lurena Nida, NP      . methocarbamol (ROBAXIN) tablet 500 mg  500 mg Oral Q6H PRN Lurena Nida, NP   500 mg at 07/28/15 0814  . nicotine (NICODERM CQ - dosed in mg/24 hours) patch 21 mg  21 mg Transdermal Daily Jenne Campus, MD   21 mg at 07/28/15 0815  . OXcarbazepine (TRILEPTAL) tablet 150 mg  150 mg Oral TID Lurena Nida, NP   150 mg at 07/28/15 0814  . pantoprazole (PROTONIX) EC tablet 40 mg  40 mg Oral Daily Lurena Nida, NP   40 mg at 07/28/15 0814  . prazosin (MINIPRESS) capsule 2 mg  2 mg Oral QHS Lurena Nida, NP   2 mg at 07/27/15 2327  . venlafaxine XR (EFFEXOR-XR) 24 hr capsule 150 mg  150 mg Oral Q breakfast Lurena Nida, NP   150 mg at 07/28/15 2761   PTA Medications: Prescriptions prior to admission  Medication Sig Dispense Refill Last Dose  . asenapine (SAPHRIS) 5 MG SUBL 24 hr tablet Place 1 tablet (5 mg total) under the tongue at bedtime. 14 tablet 0 07/26/2015 at Unknown time  . clonazePAM (KLONOPIN) 1 MG tablet Take 1 mg by mouth 3 (three) times daily as needed for anxiety.   07/27/2015 at Unknown  time  . gabapentin (NEURONTIN) 400 MG capsule Take 400 mg by mouth 3 (three) times daily.  1 past week  . hydrOXYzine (ATARAX/VISTARIL) 50 MG tablet Take 1 tablet (50 mg total) by mouth 3 (three)  times daily as needed for anxiety. 30 tablet 0 07/27/2015 at Unknown time  . methocarbamol (ROBAXIN) 500 MG tablet Take 1 tablet (500 mg total) by mouth every 6 (six) hours as needed for muscle spasms. 16 tablet 0 07/27/2015 at Unknown time  . OXcarbazepine (TRILEPTAL) 150 MG tablet Take 1 tablet (150 mg total) by mouth 3 (three) times daily. 90 tablet 0 07/27/2015 at Unknown time  . oxyCODONE-acetaminophen (PERCOCET/ROXICET) 5-325 MG tablet Take 1 tablet by mouth 4 (four) times daily as needed.  0 Past Week at Unknown time  . pantoprazole (PROTONIX) 40 MG tablet Take 1 tablet (40 mg total) by mouth daily. 30 tablet 0 07/27/2015 at Unknown time  . Polyvinyl Alcohol-Povidone (MURINE TEARS FOR DRY EYES OP) Apply 2 drops to eye 4 (four) times daily as needed (dry eyes).   Past Week at Unknown time  . prazosin (MINIPRESS) 2 MG capsule Take 1 capsule (2 mg total) by mouth at bedtime. 30 capsule 0 07/26/2015 at Unknown time  . tamsulosin (FLOMAX) 0.4 MG CAPS capsule Take 0.4 mg by mouth at bedtime.  1 2 weeks  . venlafaxine XR (EFFEXOR-XR) 150 MG 24 hr capsule Take 1 capsule (150 mg total) by mouth daily with breakfast. 30 capsule 0 07/27/2015 at Unknown time    Musculoskeletal: Strength & Muscle Tone: within normal limits Gait & Station: normal Patient leans: N/A  Psychiatric Specialty Exam: Physical Exam  Vitals reviewed. Psychiatric: His mood appears anxious. Thought content is not paranoid and not delusional. He exhibits a depressed mood. He expresses suicidal (passive) ideation. He expresses no homicidal ideation. He expresses no homicidal plans.    Review of Systems  Psychiatric/Behavioral: Positive for depression and suicidal ideas. The patient is nervous/anxious.   All other systems reviewed and are negative.   Blood pressure 124/79, pulse 90, temperature 98.1 F (36.7 C), temperature source Oral, resp. rate 18, height _0  (1.778 m), weight 96.163 kg (212 lb).Body mass index is 30.42  kg/(m^2).   General Appearance: Disheveled  Eye Sport and exercise psychologist:: Fair  Speech: Clear and Coherent  Volume: Decreased  Mood: Anxious and Depressed  Affect: Congruent  Thought Process: Circumstantial  Orientation: Full (Time, Place, and Person)  Thought Content: Rumination  Suicidal Thoughts: No  Homicidal Thoughts: No  Memory: Immediate; Good Recent; Good Remote; Good  Judgement: Fair  Insight: Fair  Psychomotor Activity: Normal  Concentration: Good  Recall: Good  Fund of Knowledge:Good  Language: Fair  Akathisia: Negative  Handed: Right  AIMS (if indicated):    Assets: Communication Skills Resilience  ADL's: Intact  Cognition: WNL   Sleep:  fair       Treatment Plan Summary: Daily contact with patient to assess and evaluate symptoms and progress in treatment and Medication management   Medications:  -Vistaril 35m q6h prn anxiety -Continue Saphris 531mqhs mood stabilization -Continue Nicotine patch -Continue Trileptal [id number] for mood stabilization -Continue Prazosin 2mg15mily (move to AM as pt takes this at home and has HTN with minimal risk of hypotensive episode) -Continue Effexor 150mg23mly for MDD/anxiety   Observation Level/Precautions:  15 minute checks  Laboratory:  Labs resulted, reviewed, and stable at this time.   Psychotherapy:  Group therapy, individual therapy, psychoeducation  Medications:  See MAR above  Consultations:  None    Discharge Concerns: None    Estimated LOS: 5-7 days  Other:  N/A    I certify that inpatient services furnished can reasonably be expected to improve the patient's condition.    [place], Elyse Jarvis, FNP-BC 10/16/20169:54 AM I personally assessed the patient, reviewed the physical exam and labs and formulated the treatment plan Geralyn Flash A. Sabra Heck, M.D.

## 2015-07-29 DIAGNOSIS — F3130 Bipolar disorder, current episode depressed, mild or moderate severity, unspecified: Principal | ICD-10-CM

## 2015-07-29 NOTE — Progress Notes (Signed)
Patient ID: Sean Wyatt, male   DOB: July 11, 1966, 49 y.o.   MRN: [id number] PER STATE REGULATIONS 482.30  THIS CHART WAS REVIEWED FOR MEDICAL NECESSITY WITH RESPECT TO THE PATIENT'S ADMISSION/DURATION OF STAY.  NEXT REVIEW DATE:07/31/15  Loura HaltBARBARA Shlomie Romig, RN, BSN CASE MANAGER
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
## 2015-07-29 NOTE — Plan of Care (Signed)
Problem: Consults Goal: Depression Patient Education See Patient Education Module for education specifics.  Outcome: Completed/Met Date Met:  07/29/15 Discussed depression/coping skills with patient.

## 2015-07-29 NOTE — Progress Notes (Signed)
Adult Psychoeducational Group Note  Date:  07/29/2015 Time:  8:35 PM  Group Topic/Focus:  Wrap-Up Group:   The focus of this group is to help patients review their daily goal of treatment and discuss progress on daily workbooks.  Participation Level:  Active  Participation Quality:  Appropriate  Affect:  Appropriate  Cognitive:  Appropriate  Insight: Good  Engagement in Group:  Engaged  Modes of Intervention:  Discussion  Additional Comments:  Pt was pleasant during wrap-up group. Pt rated overall day a 6 out of 10 because of positive interactions and communication with other patients on the unit. Pt reported that his goal for the day was to work on his anxiety, which he feels that he did not achieve, however is glad that he tried.   Cleotilde NeerJasmine S Anastasios Melander 07/29/2015, 9:05 PM

## 2015-07-29 NOTE — [facility] Group Notes (Signed)
[facility] LCSW Group Therapy  07/29/2015 1:15pm  Type of Therapy:  Group Therapy vercoming Obstacles  Participation Level:  Active  Participation Quality:  Appropriate   Affect:  Appropriate  Cognitive:  Appropriate and Oriented  Insight:  Developing/Improving and Improving  Engagement in Therapy:  Improving  Modes of Intervention:  Discussion, Exploration, Problem-solving and Support  Description of Group/Summary of Progress:    In this group patients will be encouraged to explore what they see as obstacles to their own wellness and recovery. They will be guided to discuss their thoughts, feelings, and behaviors related to these obstacles. The group will process together ways to cope with barriers, with attention given to specific choices patients can make. Each patient will be challenged to identify changes they are motivated to make in order to overcome their obstacles. This group will be process-oriented, with patients participating in exploration of their own experiences as well as giving and receiving support and challenge from other group members.  Therapeutic Modalities:   Cognitive Behavioral Therapy Solution Focused Therapy Motivational Interviewing Relapse Prevention Therapy   Sean CordialLauren Wyatt, LCSWA 07/29/2015 5:20 PM

## 2015-07-29 NOTE — Progress Notes (Signed)
D:  Patient's self inventory sheet, patient sleeps good, sleep medication is helpful.  Fair appetite, normal energy level, unsure about concentration.  Rated depression 6, denied hopeless, anxiety 7.  Denied withdrawals.  Denied SI.  Physical problems, neck/back pain, pain medication does help some.  Goal is to work on anxiety.  Plans to attend groups, take medicatons.  No discharge plans at this time. A:  Medications administered per MD orders.  Emotional support and encouragement given patient. R:  Denied SI and HI, contracts for safety.  Denied A/V hallucinations.  Safety maintained with 15 minute checks.

## 2015-07-29 NOTE — [facility] Group Notes (Signed)
[facility]  07/29/2015 8:45 AM  Participation Quality: Alert, Appropriate and Oriented  Mood/Affect: Flat  Depression Rating: 3  Anxiety Rating: 7  Thoughts of Suicide: Pt denies SI/HI  Will you contract for safety? Yes  Current AVH: Pt denies  Plan for Discharge/Comments: Pt attended discharge planning group and actively participated in group. CSW discussed suicide prevention education with the group and encouraged them to discuss discharge planning and any relevant barriers. Pt reports that his depression has improved. He verbalizes that he will return to [place] where he lives and follow-up with his PCP for medications.  Transportation Means: Pt reports access to transportation  Supports: No supports mentioned at this time  Chad CordialLauren Carter, LCSWA 07/29/2015 9:54 AM c

## 2015-07-30 LAB — HIV ANTIBODY (ROUTINE TESTING W REFLEX): HIV Screen 4th Generation wRfx: NONREACTIVE

## 2015-07-30 MED ORDER — ACETAMINOPHEN 325 MG PO TABS
650.0000 mg | ORAL_TABLET | Freq: Four times a day (QID) | ORAL | Status: DC | PRN
  Administered 2015-07-31: 650 mg via ORAL
  Filled 2015-07-30: qty 2

## 2015-07-30 MED ORDER — MELOXICAM 15 MG PO TABS
15.0000 mg | ORAL_TABLET | Freq: Every day | ORAL | Status: DC
  Administered 2015-07-31 – 2015-08-02 (×3): 15 mg via ORAL
  Filled 2015-07-30 (×4): qty 1

## 2015-07-30 NOTE — Tx Team (Signed)
Interdisciplinary Treatment Plan Update (Adult) Date: 07/30/2015   Date: 07/30/2015 4:22 PM  Progress in Treatment:  Attending groups: Yes  Participating in groups: Yes  Taking medication as prescribed: Yes  Tolerating medication: Yes  Family/Significant othe contact made: No, Pt declines family contact Patient understands diagnosis: Yes Discussing patient identified problems/goals with staff: Yes  Medical problems stabilized or resolved: Yes  Denies suicidal/homicidal ideation: Yes Patient has not harmed self or Others: Yes   New problem(s) identified: None identified at this time.   Discharge Plan or Barriers: Pt will return to his home in [place] and follow-up with his psychiatrist; is on waiting list for therapy at [facility] in [place]  Additional comments: n/a   Reason for Continuation of Hospitalization:  Depression Medication stabilization Suicidal ideation  Estimated length of stay: 2-3 days  Review of initial/current patient goals per problem list:   1.  Goal(s): Patient will participate in aftercare plan  Met:  Yes  Target date: 3-5 days from date of admission   As evidenced by: Patient will participate within aftercare plan AEB aftercare provider and housing plan at discharge being identified.   07/30/15: Pt will return home and follow-up with outpatient providers  2.  Goal (s): Patient will exhibit decreased depressive symptoms and suicidal ideations.  Met:  Yes  Target date: 3-5 days from date of admission   As evidenced by: Patient will utilize self rating of depression at 3 or below and demonstrate decreased signs of depression or be deemed stable for discharge by MD.  07/30/15: Pt rates depression at 2/10; denies SI  Attendees:  Patient:    Family:    Physician: Dr. Parke Poisson, MD  07/30/2015 4:22 PM  Nursing: Lars Pinks, RN Case manager  07/30/2015 4:22 PM  Clinical Social Worker Norman Clay, MSW 07/30/2015 4:22 PM  Other: Jake Bathe Liasion 07/30/2015 4:22 PM  Clinical: Gaylan Gerold RN 07/30/2015 4:22 PM  Other: , RN Charge Nurse 07/30/2015 4:22 PM  Other:     Peri Maris, [place] MSW

## 2015-07-30 NOTE — Plan of Care (Signed)
Problem: Diagnosis: Increased Risk For Suicide Attempt Goal: STG-Patient Will Comply With Medication Regime Outcome: Progressing Sean Wyatt is compliant at this time.

## 2015-07-30 NOTE — Progress Notes (Addendum)
Patient ID: Sean Wyatt, male   DOB: 1966/03/06, 49 y.o.   MRN: [id number] [facility] MD Progress Note  07/30/2015 1:06 PM Rigby Leonhardt  MRN:  [id number] Subjective:   Patient reports partially improved mood and is feeling less depressed, although still not feeling at baseline. He states he remains vaguely anxious and depressed, but denies any suicidal ideations. He states he is experiencing ongoing back pain, and states that at home in [place], he uses Narcotic medication/ Fentanyl patch, for management of pain. He feels current NSAID management is not addressing pain as well as narcotic medications do.   Objective : I have discussed case with treatment team and have met with patient . Patient has been going to groups, and is visible on unit . He  Reports some ongoing depression, anxiety, but acknowledges significant improvement compared to admission.  He denies any current suicidal ideations, and denies any psychotic symptoms. As reported by staff, he has been more medication seeking today, requesting opiate medication. CSW/ RN have , with patient's consent, contacted patient's pain clinic in [place] and report is that patient was discharged from clinic and last received opiates in August . Patient states he was prescribed opiates in [place], during an ER visit recently.  We discussed potential medication management strategies to address pain, and reviewed rationale to minimize use of opiates/potentially addictive medications.  Agrees to increase Mobic dose- denies side effects- and to continue lidoderm patch to manage pain. Today patient denies medication side effects. We reviewed disposition planning , he is wanting to return [facility] after discharge, and plans to drive there - his car is in hospital parking lot .Marland Kitchen     Principal Problem: Bipolar I disorder, most recent episode depressed ([place]) Diagnosis:   Patient Active Problem List   Diagnosis Date Noted  . MDD (major depressive disorder), recurrent  severe, without psychosis ([place]) [F33.2] 07/28/2015  . Bipolar I disorder, most recent episode depressed ([place]) [F31.30] 07/28/2015  . Bipolar mood disorder ([place]) [F31.9] 07/25/2015  . Depression [F32.9] 07/25/2015  . MDD (major depressive disorder) ([place]) [F32.9] 07/25/2015   Total Time spent with patient: 25 minutes  Past Psychiatric History: Patient was diagnosed with bipolar mood disorder.    Past Medical History:  Past Medical History  Diagnosis Date  . Kidney stones   . Bipolar 1 disorder ([place])   . Seizures [facility])     Past Surgical History  Procedure Laterality Date  . Amputation    . Hernia repair     Family History: History reviewed. No pertinent family history. Family Psychiatric  History:  Bipolar mood disorder Social History:  History  Alcohol Use No     History  Drug Use No    Social History   Social History  . Marital Status: Divorced    Spouse Name: N/A  . Number of Children: N/A  . Years of Education: N/A   Social History Main Topics  . Smoking status: Current Every Day Smoker  . Smokeless tobacco: None  . Alcohol Use: No  . Drug Use: No  . Sexual Activity: Not Currently   Other Topics Concern  . None   Social History Narrative   Additional Social History:     Sleep: improving   Appetite:  improving  Current Medications: Current Facility-Administered Medications  Medication Dose Route Frequency Provider Last Rate Last Dose  . acetaminophen (TYLENOL) tablet 650 mg  650 mg Oral Q6H PRN Lurena Nida, NP   650 mg at 07/29/15 1325  .  alum & mag hydroxide-simeth (MAALOX/MYLANTA) 200-200-20 MG/5ML suspension 30 mL  30 mL Oral Q4H PRN Lurena Nida, NP      . asenapine (SAPHRIS) sublingual tablet 10 mg  10 mg Sublingual QHS Nicholaus Bloom, MD   10 mg at 07/29/15 2208  . clonazePAM (KLONOPIN) tablet 1 mg  1 mg Oral TID PRN Nicholaus Bloom, MD   1 mg at 07/30/15 0543  . hydrOXYzine (ATARAX/VISTARIL) tablet 50 mg  50 mg Oral Q6H PRN Nicholaus Bloom, MD    50 mg at 07/30/15 1210  . lidocaine (LIDODERM) 5 % 1 patch  1 patch Transdermal Q24H Benjamine Mola, FNP   1 patch at 07/28/15 1831  . magnesium hydroxide (MILK OF MAGNESIA) suspension 30 mL  30 mL Oral Daily PRN Lurena Nida, NP      . meloxicam (MOBIC) tablet 7.5 mg  7.5 mg Oral Daily Benjamine Mola, FNP   7.5 mg at 07/30/15 0825  . methocarbamol (ROBAXIN) tablet 750 mg  750 mg Oral Q6H PRN Benjamine Mola, FNP   750 mg at 07/30/15 0827  . nicotine (NICODERM CQ - dosed in mg/24 hours) patch 21 mg  21 mg Transdermal Daily Jenne Campus, MD   21 mg at 07/30/15 0826  . OXcarbazepine (TRILEPTAL) tablet 150 mg  150 mg Oral TID Lurena Nida, NP   150 mg at 07/30/15 1209  . pantoprazole (PROTONIX) EC tablet 40 mg  40 mg Oral Daily Lurena Nida, NP   40 mg at 07/30/15 0825  . prazosin (MINIPRESS) capsule 2 mg  2 mg Oral Q0600 Nicholaus Bloom, MD   2 mg at 07/30/15 0825  . venlafaxine XR (EFFEXOR-XR) 24 hr capsule 225 mg  225 mg Oral Q breakfast Nicholaus Bloom, MD   225 mg at 07/30/15 0825    Lab Results:  No results found for this or any previous visit (from the past 66 hour(s)).  Physical Findings: AIMS: Facial and Oral Movements Muscles of Facial Expression: None, normal Lips and Perioral Area: None, normal Jaw: None, normal Tongue: None, normal,Extremity Movements Upper (arms, wrists, hands, fingers): None, normal Lower (legs, knees, ankles, toes): None, normal, Trunk Movements Neck, shoulders, hips: None, normal, Overall Severity Severity of abnormal movements (highest score from questions above): None, normal Incapacitation due to abnormal movements: None, normal Patient's awareness of abnormal movements (rate only patient's report): No Awareness, Dental Status Current problems with teeth and/or dentures?: No Does patient usually wear dentures?: Yes  CIWA:  CIWA-Ar Total: 1 COWS:  COWS Total Score: 2  Musculoskeletal: Strength & Muscle Tone: within normal limits Gait & Station:  normal Patient leans: N/A  Psychiatric Specialty Exam: Review of Systems  Neurological: Positive for tremors.  All other systems reviewed and are negative. denies abdominal pain, nausea, vomiting . No chest pain, no shortness of breath  Blood pressure 124/87, pulse 88, temperature 97.8 F (36.6 C), temperature source Oral, resp. rate 16, height 5' 10"  (1.778 m), weight 212 lb (96.163 kg).Body mass index is 30.42 kg/(m^2).   General Appearance:   Improved grooming  Eye Contact::  Good   Speech: Clear , normal rate   Volume: normal   Mood:  Improving , less depressed    Affect:   Less constricted, feels vaguely anxious   Thought Process: linear, no flight of ideations  Orientation: Full (Time, Place, and Person)  Thought Content: no hallucinations, no delusions  Suicidal Thoughts: Noat this time denies any  plan or intention of hurting self and contracts for safety   Homicidal Thoughts: No  Memory: Immediate; Good Recent; Good Remote; Good  Judgement: improving   Insight: improving   Psychomotor Activity: Normal  Concentration: Good  Recall: Good  Fund of Knowledge:Good  Language: Fair  Akathisia: Negative  Handed: Right  AIMS (if indicated):   Assets: Communication Skills Resilience  ADL's: Intact  Cognition: WNL   Sleep: fair          Assessment - patient  Presents improved compared to admission- mood is improving gradually and he is less depressed, although he states he still does not feel back to baseline. He is tolerating medications well. At this time he is future oriented, and starting to focus more on disposition planning. He reports he plans to return to [place] and continue outpatient  Psychiatric treatment there .  Of note, he is reporting increased lower back pain  And is focused on medication issues, requesting opiates for pain. Report is that he was discharged from pain clinic in [place] in August  . Of note, does not appear restless or to be  in any acute distress at present .   Agrees to Mobic  titration, which he is tolerating well, and  To continue Robaxin PRNs and Lidoderm patch.   We have reviewed rationale to minimize or avoid opiate medication  due to addictive, sedating ,  and potential drug- drug interaction / effects   Treatment Plan Summary: Daily contact with patient to assess and evaluate symptoms and progress in treatment and Medication management  Continue to encourage milieu, group participation to work on coping skills and symptom reduction. Continue Effexor XR 225 mgrs QDAY for management of Depression Continue Saphris 10 mgrs QHS for management of Mood Disorder, Bipolar Disorder  Continue Trileptal 150 mgrs TID for management of Mood Disorder, Bipolar Disorder Continue Minipress 2 mgrs QDAY for management of PTSD related nightmares  Continue Klonopin 1 mgr TID PRN severe anxiety, as needed  Increase Meloxicam to 15 mgrs QDAY, continue Robaxin PRNs, continue Lidoderm Patch for ongoing lower back pain.  Continue Protonix for GERD and Lidoderm patch for chronic back pain Treatment team working on disposition planning   Neita Garnet , MD   07/30/2015, 1:06 PM

## 2015-07-30 NOTE — Progress Notes (Signed)
Patient ID: Sean Wyatt, male   DOB: 1966-08-07, 49 y.o.   MRN: [id number]  Writer contacted [facility] in [place] to check accuracy of pain medication patient reports he was receiving. Per pharmacy patient was last prescribed Fentanyl 25 transdermal patches on 05/13/15 #10 and was to apply every 72 hours. Treatment team made aware.

## 2015-07-30 NOTE — Progress Notes (Signed)
Patient ID: Sean Wyatt, male   DOB: 26-Aug-1966, 49 y.o.   MRN: [id number]  DAR: Pt. Denies SI/HI and A/V Hallucinations. He reports sleep was good, appetite is good, energy level is low, and concentration is good. He rates his depression 5/10, hopelessness 4/10, and anxiety 6/10. Patient continues to report pain in back and neck that is chronic in nature. He states, "it's bone against bone." Patient continues to ruminate about pain medications and insists that staff call his pharmacy in [place] or PCP. When writer spoke to patient 1:1 about his pain medication and not getting controlled medications at this time he states, "so I'm not gonna have any relief?" However, patient is receiving both PRN and scheduled medication to help with pain. Support and encouragement provided to the patient. Scheduled medications administered to patient per physician's orders. Patient is somewhat guarded and became increasingly irritable about MD not prescribing pain medication. Q15 minute checks are maintained for safety.

## 2015-07-30 NOTE — Progress Notes (Signed)
Pt is alert and oriented x4. Pt endorses severe depression with mild anxiety. He states, "I got something for anxiety earlier and I feel much better but my depression is always there; still like a 9." Pt also endorses mild back pain of 4 on a 0-10 pain scale. Pt however, denies SI, HI, and AVH. Pt continues to be non-violent.   A: Medications offered as prescribed.  Support, encouragement, and safe environment provided.  15-minute safety checks continue.  R: Pt was med compliant.  Pt did not attend wrap-up group. Safety checks continue

## 2015-07-30 NOTE — [facility] Group Notes (Signed)
[facility] LCSW Group Therapy 07/30/2015 1:15 PM  Type of Therapy: Group Therapy- Feelings about Diagnosis  Pt did not attend, declined invitation.   Chad CordialLauren Carter, LCSWA 07/30/2015 4:21 PM

## 2015-07-30 NOTE — Progress Notes (Signed)
Adult Psychoeducational Group Note  Date:  07/30/2015 Time:  8:38 PM  Group Topic/Focus:  Wrap-Up Group:   The focus of this group is to help patients review their daily goal of treatment and discuss progress on daily workbooks.  Participation Level:  Minimal  Participation Quality:  Appropriate  Affect:  Depressed  Cognitive:  Appropriate  Insight: Limited  Engagement in Group:  Limited  Modes of Intervention:  Discussion  Additional Comments:  Pt rated overall day a 3 out of 10 because of pain. Pt reported that nothing positive happened today and his goal for the day was to work on his anxiety, which he feels that he did not achieve.   Sean NeerJasmine Wyatt Sean Wyatt 07/30/2015, 9:54 PM

## 2015-07-30 NOTE — Progress Notes (Signed)
Recreation Therapy Notes  Animal-Assisted Activity (AAA) Program Checklist/Progress Notes Patient Eligibility Criteria Checklist & Daily Group note for Rec Tx Intervention  Date: 10.18.2016  Time: 2:45pm [address]: [address]    AAA/T Program Assumption of Risk Form signed by Patient/ or Parent Legal Guardian yes  Patient is free of allergies or sever asthma yes  Patient reports no fear of animals yes  Patient reports no history of cruelty to animals yes  Patient understands his/her participation is voluntary yes  Behavioral Response: Did not attend.   Marykay Lexenise L Humaira Sculley, LRT/CTRS       Jamilex Bohnsack L 07/30/2015 3:10 PM

## 2015-07-30 NOTE — [facility] Group Notes (Signed)
[facility] Group Notes:  (Nursing/MHT/Case Management/Adjunct)  Date:  07/30/2015  Time:  0900 am  Type of Therapy:  Psychoeducational Skills  Participation Level:  Active  Participation Quality:  Appropriate and Attentive  Affect:  Appropriate  Cognitive:  Alert and Appropriate  Insight:  Good  Engagement in Group:  Supportive  Modes of Intervention:  Support  Summary of Progress/Problems: Patient's goal is to "laugh as much as possible."  Sean Wyatt spoke about his depression and attempting to still find humor.  He hopes to get his anxiety under control.   [name], [name] 07/30/2015, 9:45 AM

## 2015-07-31 LAB — HEPATITIS C ANTIBODY: HCV Ab: >11 s/co ratio — ABNORMAL HIGH (ref 0.0–0.9)

## 2015-07-31 LAB — HEPATITIS B SURFACE AG, CONFIRM: HBsAg Confirmation: POSITIVE — AB

## 2015-07-31 LAB — HEPATITIS B SURFACE ANTIGEN

## 2015-07-31 LAB — HEPATITIS B E ANTIGEN: Hep B E Ag: NEGATIVE

## 2015-07-31 MED ORDER — CLONAZEPAM 1 MG PO TABS: 1.0000 mg | ORAL_TABLET | Freq: Two times a day (BID) | ORAL | Status: DC | PRN

## 2015-07-31 MED ORDER — HYDROXYZINE HCL 25 MG PO TABS: 25.0000 mg | ORAL_TABLET | Freq: Two times a day (BID) | ORAL | Status: DC | PRN

## 2015-07-31 MED ORDER — METHOCARBAMOL 750 MG PO TABS
750.0000 mg | ORAL_TABLET | Freq: Three times a day (TID) | ORAL | Status: DC | PRN
  Administered 2015-07-31 – 2015-08-01 (×2): 750 mg via ORAL
  Filled 2015-07-31 (×2): qty 1

## 2015-07-31 MED ORDER — HYDROXYZINE HCL 25 MG PO TABS
25.0000 mg | ORAL_TABLET | Freq: Four times a day (QID) | ORAL | Status: DC | PRN
  Administered 2015-07-31: 25 mg via ORAL
  Filled 2015-07-31: qty 1

## 2015-07-31 MED ORDER — OXYCODONE-ACETAMINOPHEN 5-325 MG PO TABS: 1.0000 | ORAL_TABLET | Freq: Four times a day (QID) | ORAL | Status: DC | PRN

## 2015-07-31 MED ORDER — CLONAZEPAM 0.5 MG PO TABS
0.5000 mg | ORAL_TABLET | Freq: Three times a day (TID) | ORAL | Status: DC | PRN
  Administered 2015-07-31 – 2015-08-02 (×5): 0.5 mg via ORAL
  Filled 2015-07-31 (×6): qty 1

## 2015-07-31 MED ORDER — OXYCODONE HCL 5 MG PO TABS
5.0000 mg | ORAL_TABLET | Freq: Four times a day (QID) | ORAL | Status: DC | PRN
  Administered 2015-07-31 – 2015-08-02 (×6): 5 mg via ORAL
  Filled 2015-07-31 (×7): qty 1

## 2015-07-31 NOTE — [facility] Group Notes (Signed)
[facility]  07/31/2015 8:45 AM  Participation Quality: Alert, Appropriate and Oriented  Mood/Affect: Flat  Depression Rating: 5  Anxiety Rating: 5  Thoughts of Suicide: Pt denies SI/HI  Will you contract for safety? Yes  Current AVH: Pt denies  Plan for Discharge/Comments: Pt attended discharge planning group and actively participated in group. CSW discussed suicide prevention education with the group and encouraged them to discuss discharge planning and any relevant barriers. Pt reports that he is feeling worse today, more depressed due to pain. Pt observed to be distracting in group, requiring redirection.  Transportation Means: Pt reports access to transportation  Supports: No supports mentioned at this time  Chad CordialLauren Carter, LCSWA 07/31/2015 9:18 AM

## 2015-07-31 NOTE — Progress Notes (Signed)
Patient ID: Sean Wyatt, male   DOB: 04-12-1966, 49 y.o.   MRN: [id number]  DAR: Pt. Denies SI/HI and A/V Hallucinations. Patient reports pain that is chronic in neck and back. Support and encouragement provided to the patient. Scheduled medications administered to patient per physician's orders. Patient reports that he slept fair last night, appetite is fair, energy level is low, and concentration is poor. He rates his depression 6/10, anxiety 7/10, and hopelessness at 7/10. Patient is intrusive and continues to be medication seeking. After receiving PRN narcotic medication patient's affect changed and patient was more active in the milieu. Q15 minute checks are maintained for safety.

## 2015-07-31 NOTE — Plan of Care (Signed)
Problem: Diagnosis: Increased Risk For Suicide Attempt Goal: STG-Patient Will Report Suicidal Feelings to Staff Outcome: Progressing Patient denies SI or thoughts of self harm however is able to contract for safety.

## 2015-07-31 NOTE — [facility] Group Notes (Signed)
[facility] LCSW Group Therapy 07/31/2015 1:15 PM  Type of Therapy: Group Therapy- Emotion Regulation  Participation Level: Active   Participation Quality:  Appropriate  Affect: Appropriate  Cognitive: Alert and Oriented   Insight:  Developing/Improving  Engagement in Therapy: Developing/Improving and Engaged   Modes of Intervention: Clarification, Confrontation, Discussion, Education, Exploration, Limit-setting, Orientation, Problem-solving, Rapport Building, Dance movement psychotherapisteality Testing, Socialization and Support  Summary of Progress/Problems: The topic for group today was emotional regulation. This group focused on both positive and negative emotion identification and allowed group members to process ways to identify feelings, regulate negative emotions, and find healthy ways to manage internal/external emotions. Group members were asked to reflect on a time when their reaction to an emotion led to a negative outcome and explored how alternative responses using emotion regulation would have benefited them. Group members were also asked to discuss a time when emotion regulation was utilized when a negative emotion was experienced. Pt active in group discussion, however monopolizing at times. This was appropriate in the milieu as others were minimal. Pt expressed how in childhood emotional expression was looked down upon which shaped his emotion regulation in adulthood. He reported that he thinks that it is necessary that one have a friend or support that is honest and will keep him accountable. Pt expressed his difficulty seeing others' perspectives and being able to provide multiple explanations for a situation. He expressed a desire to work on this to control his anger.   Sean CordialLauren Carter, LCSWA 07/31/2015 2:40 PM

## 2015-07-31 NOTE — Progress Notes (Signed)
D: Patient in the dayroom on [facility].  Patient states his anxiety is a 6/10 today.  Patient states his goal for today was to get relief from anxiety and pain.  Patient states he was able to talk to the doctor and the doctor prescribed medication that helps with both.  Patient denies SI/HI and denies AVH. A: Staff to monitor Q 15 mins for safety.  Encouragement and support offered.  Scheduled medications administered per orders. R: Patient remains safe on the unit.  Patient attended group tonight.  Patient visible on unit.  Patient taking prescribed medications.

## 2015-07-31 NOTE — Progress Notes (Signed)
D: Sean Wyatt attended wrap up group tonight. He is complaining of neck and back pain 7/10. Denies SI/HI/AVH and verbally contracts for safety at this time. Rates depression 6/10. He slept most of the day. States his chronic back pain has been making him more depressed.  A: Encouragement and support given.  R: Continue to monitor for patient safety and medication effectiveness.

## 2015-07-31 NOTE — Progress Notes (Addendum)
Patient ID: Sean Wyatt, male   DOB: 1966-03-15, 49 y.o.   MRN: [id number] [facility] MD Progress Note  07/31/2015 3:26 PM [name]  MRN:  [id number] Subjective:   Patient has reported increased depression and anxiety, which he attributes to worsening pain. As noted in prior notes, he has reported severe neck , back pain, which is chronic, and which he states contributes to /excacerbates his depression. He states he has been on opiates for several years, but is aware that his pain management clinic in [place] has discharged him. He denies medication side effects . Although depressed, denies any suicidal ideations, and contracts for safety on unit- he remains future oriented, and plans to travel to [place] after discharge .   Objective : I have discussed case with treatment team and have met with patient . As discussed with staff, patient has been increasingly focused on pain, reporting severe neck pain causing him to feel more depressed, anxious, and disrupting his sleep. At this time does not appear to be in any acute distress, but reports pain as severe (8/10). We discussed opiate related issues at length, and have reviewed addictive and sedating properties of opiates with him. 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 Patient states he plans to discuss alternatives with his PCP upon return to [place], he thinks he may be restarted on opiates. We discussed lab findings, Hep C Ab (+) - HIV (-) . Hep B S Ag (+) . We reviewed the importance of promptly following up with  His PCP, GI or Infectious Disease Specialist in order to be monitored and started on appropriate treatment if needed . Marland Kitchen     Principal Problem: Bipolar I disorder, most recent episode depressed ([place]) Diagnosis:   Patient Active Problem List   Diagnosis Date Noted  . MDD (major depressive disorder), recurrent severe, without psychosis ([place]) [F33.2] 07/28/2015  . Bipolar I  disorder, most recent episode depressed ([place]) [F31.30] 07/28/2015  . Bipolar mood disorder ([place]) [F31.9] 07/25/2015  . Depression [F32.9] 07/25/2015  . MDD (major depressive disorder) ([place]) [F32.9] 07/25/2015   Total Time spent with patient: 25 minutes  Past Psychiatric History: Patient was diagnosed with bipolar mood disorder.    Past Medical History:  Past Medical History  Diagnosis Date  . Kidney stones   . Bipolar 1 disorder ([place])   . Seizures [facility])     Past Surgical History  Procedure Laterality Date  . Amputation    . Hernia repair     Family History: History reviewed. No pertinent family history. Family Psychiatric  History:  Bipolar mood disorder Social History:  History  Alcohol Use No     History  Drug Use No    Social History   Social History  . Marital Status: Divorced    Spouse Name: N/A  . Number of Children: N/A  . Years of Education: N/A   Social History Main Topics  . Smoking status: Current Every Day Smoker  . Smokeless tobacco: None  . Alcohol Use: No  . Drug Use: No  . Sexual Activity: Not Currently   Other Topics Concern  . None   Social History Narrative   Additional Social History:     Sleep: improving   Appetite:  improving  Current Medications: Current Facility-Administered Medications  Medication Dose Route Frequency Provider Last Rate Last Dose  . alum & mag hydroxide-simeth (MAALOX/MYLANTA) 200-200-20 MG/5ML suspension 30 mL  30 mL Oral Q4H  PRN Lurena Nida, NP      . asenapine (SAPHRIS) sublingual tablet 10 mg  10 mg Sublingual QHS Nicholaus Bloom, MD   10 mg at 07/30/15 2300  . clonazePAM (KLONOPIN) tablet 1 mg  1 mg Oral BID PRN Jenne Campus, MD      . hydrOXYzine (ATARAX/VISTARIL) tablet 25 mg  25 mg Oral Q6H PRN Jenne Campus, MD   25 mg at 07/31/15 1315  . lidocaine (LIDODERM) 5 % 1 patch  1 patch Transdermal Q24H Benjamine Mola, FNP   1 patch at 07/28/15 1831  . magnesium hydroxide (MILK OF MAGNESIA) suspension  30 mL  30 mL Oral Daily PRN Lurena Nida, NP      . meloxicam (MOBIC) tablet 15 mg  15 mg Oral Daily Jenne Campus, MD   15 mg at 07/31/15 0852  . methocarbamol (ROBAXIN) tablet 750 mg  750 mg Oral Q6H PRN Benjamine Mola, FNP   750 mg at 07/31/15 0853  . nicotine (NICODERM CQ - dosed in mg/24 hours) patch 21 mg  21 mg Transdermal Daily Jenne Campus, MD   21 mg at 07/31/15 0853  . OXcarbazepine (TRILEPTAL) tablet 150 mg  150 mg Oral TID Lurena Nida, NP   150 mg at 07/31/15 1205  . oxyCODONE (Oxy IR/ROXICODONE) immediate release tablet 5 mg  5 mg Oral Q6H PRN Jenne Campus, MD   5 mg at 07/31/15 1313  . pantoprazole (PROTONIX) EC tablet 40 mg  40 mg Oral Daily Lurena Nida, NP   40 mg at 07/31/15 0852  . prazosin (MINIPRESS) capsule 2 mg  2 mg Oral Q0600 Nicholaus Bloom, MD   2 mg at 07/31/15 [phone]  . venlafaxine XR (EFFEXOR-XR) 24 hr capsule 225 mg  225 mg Oral Q breakfast Nicholaus Bloom, MD   225 mg at 07/31/15 6222    Lab Results:  Results for orders placed or performed during the hospital encounter of 07/27/15 (from the past 48 hour(s))  Hepatitis C antibody     Status: Abnormal   Collection Time: 07/30/15  7:00 AM  Result Value Ref Range   HCV Ab >11.0 (H) 0.0 - 0.9 s/co ratio    Comment: (NOTE)                                  Negative:     < 0.8                             Indeterminate: 0.8 - 0.9                                  Positive:     > 0.9 The CDC recommends that a positive HCV antibody result be followed up with a HCV Nucleic Acid Amplification test ([id number]). Performed At: [facility] [place], [place] [id number] Lindon Romp MD [id number] Performed at [facility]   HIV antibody     Status: None   Collection Time: 07/30/15  7:00 AM  Result Value Ref Range   HIV Screen 4th Generation wRfx Non Reactive Non Reactive    Comment: (NOTE) Performed At: [facility] [place], [place]  [id number] Lindon Romp MD [id number] Performed  at [facility]   Hepatitis B e antigen     Status: None   Collection Time: 07/30/15  7:00 AM  Result Value Ref Range   Hep B E Ag Negative Negative    Comment: (NOTE) Performed At: [facility] [place], [place] [id number] Lindon Romp MD [id number] Performed at [facility]   Hepatitis B surface antigen     Status: None   Collection Time: 07/30/15  7:00 AM  Result Value Ref Range   Hepatitis B Surface Ag Confirm. indicated Negative    Comment: (NOTE) Performed At: [facility] [place], [place] [id number] Lindon Romp MD [id number] Performed at [facility]   Hepatitis B surface antigen confirmation     Status: Abnormal   Collection Time: 07/30/15  7:00 AM  Result Value Ref Range   Hep B S Ag Neutralization Positive (A)     Comment: (NOTE) Result confirmed by neutralization. Performed At: [facility] [place], [place] [id number] Lindon Romp MD [id number] Performed at [facility]     Physical Findings: AIMS: Facial and Oral Movements Muscles of Facial Expression: None, normal Lips and Perioral Area: None, normal Jaw: None, normal Tongue: None, normal,Extremity Movements Upper (arms, wrists, hands, fingers): None, normal Lower (legs, knees, ankles, toes): None, normal, Trunk Movements Neck, shoulders, hips: None, normal, Overall Severity Severity of abnormal movements (highest score from questions above): None, normal Incapacitation due to abnormal movements: None, normal Patient's awareness of abnormal movements (rate only patient's report): No Awareness, Dental Status Current problems with teeth and/or dentures?: No Does patient usually wear dentures?: Yes  CIWA:  CIWA-Ar Total: 1 COWS:  COWS Total Score: 1  Musculoskeletal: Strength & Muscle  Tone: within normal limits Gait & Station: normal Patient leans: N/A  Psychiatric Specialty Exam: Review of Systems  Neurological: Positive for tremors.  All other systems reviewed and are negative. denies abdominal pain, nausea, vomiting . No chest pain, no shortness of breath  Blood pressure 127/74, pulse 84, temperature 98.5 F (36.9 C), temperature source Oral, resp. rate 16, height _0  (1.778 m), weight 212 lb (96.163 kg).Body mass index is 30.42 kg/(m^2).   General Appearance:   Improved grooming  Eye Contact::  Good   Speech: Clear , normal rate   Volume: normal   Mood:  States feeling more depressed today due to pain    Affect:   Vaguely  constricted, but smiles at times appropriately  Thought Process: linear, no flight of ideations  Orientation: Full (Time, Place, and Person)  Thought Content: no hallucinations, no delusions, ruminative about chronic pain issues   Suicidal Thoughts: Noat this time denies any plan or intention of hurting self and contracts for safety   Homicidal Thoughts: No  Memory: Immediate; Good Recent; Good Remote; Good  Judgement: improving   Insight: improving   Psychomotor Activity: Normal  Concentration: Good  Recall: Good  Fund of Knowledge:Good  Language: Fair  Akathisia: Negative  Handed: Right  AIMS (if indicated):   Assets: Communication Skills Resilience  ADL's: Intact  Cognition: WNL   Sleep: fair          Assessment - patient   Reports ongoing depression and anxiety, which he attributes to worsening pain off opiates . States he is tolerating prescribed medications well but they are not adequately addressing his chronic pain. As discussed with RN staff , patient has not appeared restless or  in any acute distress, but has been reporting subjectively increased levels of pain./ Of note, labs confirm chronic viral hepatitis, and I have discussed this with patient -  as noted, he states he was told in the past he had chronic viral hepatitis since childhood. I have reviewed with him the importance of following up on this issue when he returns home and the existence of treatment options for these chronic infections. We also reviewed the importance of completely abstaining from alcohol in order to minimize liver damage, compromise .  Treatment Plan Summary: Daily contact with patient to assess and evaluate symptoms and progress in treatment and Medication management  Continue to encourage milieu, group participation to work on coping skills and symptom reduction. Continue Effexor XR 225 mgrs QDAY for management of Depression Continue Saphris 10 mgrs QHS for management of Mood Disorder, Bipolar Disorder  Continue Trileptal 150 mgrs TID for management of Mood Disorder, Bipolar Disorder Continue Minipress 2 mgrs QDAY for management of PTSD related nightmares  Start Oxycodone 5 mgrs Q 6 hours PRN  for severe pain as needed  D/C Acetaminophen PRNS.  Decrease Klonopin  To 0.5  mgr Q 8 hours   PRN severe anxiety, as needed  Increase Meloxicam to 15 mgrs QDAY, continue Robaxin PRNs, continue Lidoderm Patch for ongoing lower back pain.  Continue Protonix for GERD and Lidoderm patch for chronic back pain Treatment team working on disposition planning   Neita Garnet , MD   07/31/2015, 3:26 PM

## 2015-07-31 NOTE — Progress Notes (Signed)
Adult Psychoeducational Group Note  Date:  07/31/2015 Time:  9:25 PM  Group Topic/Focus:  Wrap-Up Group:   The focus of this group is to help patients review their daily goal of treatment and discuss progress on daily workbooks.  Participation Level:  Active  Participation Quality:  Appropriate and Attentive  Affect:  Appropriate  Cognitive:  Appropriate  Insight: Appropriate and Good  Engagement in Group:  Engaged  Modes of Intervention:  Education  Additional Comments:  Pt way to regulate emotion is to not let things affect him so much such as others actions and words. Pt goal for tomorrow is to maintain anxiety and pain level.   Sean Wyatt 07/31/2015, 9:25 PM

## 2015-08-01 LAB — HEPATITIS B E ANTIBODY: HEP B E AB: POSITIVE — AB

## 2015-08-01 NOTE — [facility] Group Notes (Signed)
[facility] 08/01/2015 1:15pm  Type of Therapy: Mental Health Association Presentation  Participation Level: Active  Participation Quality: Attentive  Affect: Appropriate  Cognitive: Oriented  Insight: Developing/Improving  Engagement in Therapy: Engaged  Modes of Intervention: Discussion, Education and Socialization  Summary of Progress/Problems: Mental Health Association (MHA) Speaker came to talk about his personal journey with substance abuse and addiction. The pt processed ways by which to relate to the speaker. MHA speaker provided handouts and educational information pertaining to groups and services offered by [facility]. Pt was engaged in speaker's presentation and was receptive to resources provided.    Chad CordialLauren Carter, LCSWA 08/01/2015 1:28 PM

## 2015-08-01 NOTE — [facility] Group Notes (Signed)
[facility] Group Notes:  (Nursing/MHT/Case Management/Adjunct)  Date:  08/01/2015  Time:  11:05 AM Type of Therapy:  Psychoeducational Skills  Participation Level:  Active  Participation Quality:  Appropriate  Affect:  Appropriate  Cognitive:  Appropriate  Insight:  Appropriate  Engagement in Group:  Engaged  Modes of Intervention:  Problem-solving  Summary of Progress/Problems: Pt stated that what he like doing most is being with his grandchildren. Bethann PunchesJane O Vernis Eid 08/01/2015, 11:05 AM

## 2015-08-01 NOTE — Progress Notes (Signed)
D. Pt has been present in the milieu, attended group and actively participated. Pt stated he slept like a baby, has fair appetite, normal energy and good concentration. Pt complained of anxiety, pt stated he was just informed that he is having hep - C and was worried about it. Knolopin 0.5 mg given.  Pt reported that his depression was a 3, his hopelessness was a 0, and that his anxiety was a 6. Pt reported being negative SI/HI, no AH/VH noted. A: 15 min checks continued for patient safety. R: Pts safety maintained.

## 2015-08-01 NOTE — Progress Notes (Addendum)
Patient did not attend the evening karaoke group. Pt was notified that group was beginning but remained on unit.

## 2015-08-01 NOTE — Progress Notes (Signed)
Pt requests that he follow-up with his therapy referral when he returns to [place]. Pt verbalizes that he is already on a wait list for an organization in his hometown that provides therapy and diagnosis-specific support groups.  Chad CordialLauren Carter, LCSWA Clinical Social Work [phone]

## 2015-08-01 NOTE — Tx Team (Signed)
Interdisciplinary Treatment Plan Update (Adult) Date: 08/01/2015   Date: 08/01/2015 1:29 PM  Progress in Treatment:  Attending groups: Yes  Participating in groups: Yes  Taking medication as prescribed: Yes  Tolerating medication: Yes  Family/Significant othe contact made: No, Pt declines family contact Patient understands diagnosis: Yes Discussing patient identified problems/goals with staff: Yes  Medical problems stabilized or resolved: Yes  Denies suicidal/homicidal ideation: Yes Patient has not harmed self or Others: Yes   New problem(s) identified: None identified at this time.   Discharge Plan or Barriers: Pt will return to his home in [place] and follow-up with his psychiatrist; is on waiting list for therapy at [facility] in [place]  Additional comments: n/a   Reason for Continuation of Hospitalization:  Depression Medication stabilization Suicidal ideation  Estimated length of stay: 1 day  Review of initial/current patient goals per problem list:   1.  Goal(s): Patient will participate in aftercare plan  Met:  Yes  Target date: 3-5 days from date of admission   As evidenced by: Patient will participate within aftercare plan AEB aftercare provider and housing plan at discharge being identified.   07/30/15: Pt will return home and follow-up with outpatient providers  2.  Goal (s): Patient will exhibit decreased depressive symptoms and suicidal ideations.  Met:  Yes  Target date: 3-5 days from date of admission   As evidenced by: Patient will utilize self rating of depression at 3 or below and demonstrate decreased signs of depression or be deemed stable for discharge by MD.  07/30/15: Pt rates depression at 2/10; denies SI  Attendees:  Patient:    Family:    Physician: Dr. Parke Poisson, MD  08/01/2015 1:29 PM  Nursing: Lars Pinks, RN Case manager  08/01/2015 1:29 PM  Clinical Social Worker Norman Clay, MSW 08/01/2015 1:29 PM  Other: Jake Bathe Liasion 08/01/2015 1:29 PM  Clinical: Eulogio Bear, RN 08/01/2015 1:29 PM  Other: , RN Charge Nurse 08/01/2015 1:29 PM  Other:     Peri Maris, [employer] MSW

## 2015-08-01 NOTE — Progress Notes (Signed)
D: Patient in the hallway on first approach.  Patient states, "I had a much better day."  Patient states his pain and anxiety are under control.  Patient states his goal for today was to get his pain and anxiety under control.  Patient rates depression low.  Patient denies SI/HI and denies AVH.  Patient does appears to preoccupied with his medications and he is intrusive. A: Staff to monitor Q 15 mins for safety.  Encouragement and support offered.  Scheduled medications administered per orders.  Oxycodone administered prn for neck pain.  Robaxin administered prn for muscle spasms.  Klonopin administered prn for anxiety. R: Patient remains safe on the unit.  Patient did not attend group tonight.  Patient taking administered medications.  Patient visible on the unit and interacting with peers.

## 2015-08-01 NOTE — Progress Notes (Signed)
Patient ID: Sean Wyatt, male   DOB: 06/20/1966, 49 y.o.   MRN: [id number] [facility] MD Progress Note  08/01/2015 3:57 PM Sean Wyatt  MRN:  [id number] Subjective:   Patient  Reports partial but significant improvement compared to his admission. He is less focused on pain at this time. He is starting to focus more on disposition planning and is working with CSW in helping to set up outpatient services in [place] , where he resides and to which he plans to return upon discharge . Denies medication side effects.   Objective : I have discussed case with treatment team and have met with patient . Patient improving compared to admission- mood is better, affect is more reactive, reports anxiety persists, but acknowledges overall improvement. Denies medication side effects at present. He  Is participating in groups, visible on unit, and behavior on unit has been in good control. As noted, currently more focused on disposition planning. We have again reviewed Hep B/C results. 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 He was not aware of Hep C status . We have reviewed the importance of ongoing outpatient monitoring and treatment for these chronic conditions.  We have also discussed issues related to opiates- patient states he is aware of their addictive potential, and is hoping that upon arrival to [place] he will be able to manage his pain with non narcotic strategies .     Principal Problem: Bipolar I disorder, most recent episode depressed ([place]) Diagnosis:   Patient Active Problem List   Diagnosis Date Noted  . MDD (major depressive disorder), recurrent severe, without psychosis ([place]) [F33.2] 07/28/2015  . Bipolar I disorder, most recent episode depressed ([place]) [F31.30] 07/28/2015  . Bipolar mood disorder ([place]) [F31.9] 07/25/2015  . Depression [F32.9] 07/25/2015  . MDD (major depressive disorder) ([place]) [F32.9] 07/25/2015   Total Time  spent with patient: 20 minutes  Past Psychiatric History: Patient was diagnosed with bipolar mood disorder.    Past Medical History:  Past Medical History  Diagnosis Date  . Kidney stones   . Bipolar 1 disorder ([place])   . Seizures [facility])     Past Surgical History  Procedure Laterality Date  . Amputation    . Hernia repair     Family History: History reviewed. No pertinent family history. Family Psychiatric  History:  Bipolar mood disorder Social History:  History  Alcohol Use No     History  Drug Use No    Social History   Social History  . Marital Status: Divorced    Spouse Name: N/A  . Number of Children: N/A  . Years of Education: N/A   Social History Main Topics  . Smoking status: Current Every Day Smoker  . Smokeless tobacco: None  . Alcohol Use: No  . Drug Use: No  . Sexual Activity: Not Currently   Other Topics Concern  . None   Social History Narrative   Additional Social History:     Sleep: improving   Appetite:  improving  Current Medications: Current Facility-Administered Medications  Medication Dose Route Frequency Provider Last Rate Last Dose  . alum & mag hydroxide-simeth (MAALOX/MYLANTA) 200-200-20 MG/5ML suspension 30 mL  30 mL Oral Q4H PRN Lurena Nida, NP      . asenapine (SAPHRIS) sublingual tablet 10 mg  10 mg Sublingual QHS Nicholaus Bloom, MD   10 mg at 07/31/15 2241  . clonazePAM (KLONOPIN) tablet 0.5  mg  0.5 mg Oral TID PRN Jenne Campus, MD   0.5 mg at 08/01/15 3154  . hydrOXYzine (ATARAX/VISTARIL) tablet 25 mg  25 mg Oral Q12H PRN Myer Peer Cobos, MD      . lidocaine (LIDODERM) 5 % 1 patch  1 patch Transdermal Q24H Benjamine Mola, FNP   1 patch at 07/28/15 1831  . magnesium hydroxide (MILK OF MAGNESIA) suspension 30 mL  30 mL Oral Daily PRN Lurena Nida, NP      . meloxicam (MOBIC) tablet 15 mg  15 mg Oral Daily Jenne Campus, MD   15 mg at 08/01/15 0803  . methocarbamol (ROBAXIN) tablet 750 mg  750 mg Oral Q8H PRN Jenne Campus, MD   750 mg at 07/31/15 1554  . nicotine (NICODERM CQ - dosed in mg/24 hours) patch 21 mg  21 mg Transdermal Daily Jenne Campus, MD   21 mg at 08/01/15 0805  . OXcarbazepine (TRILEPTAL) tablet 150 mg  150 mg Oral TID Lurena Nida, NP   150 mg at 08/01/15 1200  . oxyCODONE (Oxy IR/ROXICODONE) immediate release tablet 5 mg  5 mg Oral Q6H PRN Jenne Campus, MD   5 mg at 08/01/15 1302  . pantoprazole (PROTONIX) EC tablet 40 mg  40 mg Oral Daily Lurena Nida, NP   40 mg at 08/01/15 0610  . prazosin (MINIPRESS) capsule 2 mg  2 mg Oral Q0600 Nicholaus Bloom, MD   2 mg at 08/01/15 0803  . venlafaxine XR (EFFEXOR-XR) 24 hr capsule 225 mg  225 mg Oral Q breakfast Nicholaus Bloom, MD   225 mg at 08/01/15 0803    Lab Results:  No results found for this or any previous visit (from the past 15 hour(s)).  Physical Findings: AIMS: Facial and Oral Movements Muscles of Facial Expression: None, normal Lips and Perioral Area: None, normal Jaw: None, normal Tongue: None, normal,Extremity Movements Upper (arms, wrists, hands, fingers): None, normal Lower (legs, knees, ankles, toes): None, normal, Trunk Movements Neck, shoulders, hips: None, normal, Overall Severity Severity of abnormal movements (highest score from questions above): None, normal Incapacitation due to abnormal movements: None, normal Patient's awareness of abnormal movements (rate only patient's report): No Awareness, Dental Status Current problems with teeth and/or dentures?: No Does patient usually wear dentures?: Yes  CIWA:  CIWA-Ar Total: 1 COWS:  COWS Total Score: 1  Musculoskeletal: Strength & Muscle Tone: within normal limits Gait & Station: normal Patient leans: N/A  Psychiatric Specialty Exam: Review of Systems  Neurological: Positive for tremors.  All other systems reviewed and are negative. denies abdominal pain, nausea, vomiting . No chest pain, no shortness of breath Chronic back, neck pain  Blood pressure  149/91, pulse 96, temperature 98 F (36.7 C), temperature source Oral, resp. rate 16, height 5' 10"  (1.778 m), weight 212 lb (96.163 kg).Body mass index is 30.42 kg/(m^2).   General Appearance:   Improved grooming  Eye Contact::  Good   Speech: Clear , normal rate   Volume: normal   Mood:  Less depressed   Affect:    Less constricted, more reactive   Thought Process: linear, no flight of ideations  Orientation: Full (Time, Place, and Person)  Thought Content: no hallucinations, no delusions, less significantly focused on/ruminative about pain issues   Suicidal Thoughts: Noat this time denies any plan or intention of hurting self and contracts for safety   Homicidal Thoughts: No  Memory: Immediate; Good Recent; Good  Remote; Good  Judgement: improving   Insight: improving   Psychomotor Activity: Normal  Concentration: Good  Recall: Good  Fund of Knowledge:Good  Language: Fair  Akathisia: Negative  Handed: Right  AIMS (if indicated):   Assets: Communication Skills Resilience  ADL's: Intact  Cognition: WNL   Sleep: fair          Assessment - patient presenting with improved mood and affect compared to yesterday. At this time more future oriented, focusing less on pain issues, and hoping for discharge soon. Patient plans to continue outpatient medical /psychiatric treatment in [place].  He is currently tolerating medications well .  Treatment Plan Summary: Daily contact with patient to assess and evaluate symptoms and progress in treatment and Medication management  Continue to encourage milieu, group participation to work on coping skills and symptom reduction. Continue Effexor XR 225 mgrs QDAY for management of Depression Continue Saphris 10 mgrs QHS for management of Mood Disorder, Bipolar Disorder  Continue Trileptal 150 mgrs TID for management of Mood Disorder, Bipolar Disorder Continue Minipress 2 mgrs QDAY  for management of PTSD related nightmares  Continue  Oxycodone 5 mgrs Q 6 hours PRN  for severe pain as needed  Continue Klonopin   0.5  mgr Q 8  hours   PRN severe anxiety, as needed  Continue Meloxicam to 15 mgrs QDAY, continue Robaxin PRNs, continue Lidoderm Patch for ongoing lower back pain.  Continue Protonix for GERD and Lidoderm patch for chronic back pain Treatment team working on disposition planning   Neita Garnet , MD   08/01/2015, 3:57 PM

## 2015-08-02 MED ORDER — VENLAFAXINE HCL ER 75 MG PO CP24: 225.0000 mg | ORAL_CAPSULE | Freq: Every day | ORAL | Status: DC

## 2015-08-02 MED ORDER — METHOCARBAMOL 750 MG PO TABS: 750.0000 mg | ORAL_TABLET | Freq: Three times a day (TID) | ORAL | Status: DC | PRN

## 2015-08-02 MED ORDER — MELOXICAM 15 MG PO TABS: 15.0000 mg | ORAL_TABLET | Freq: Every day | ORAL | Status: DC

## 2015-08-02 MED ORDER — OXCARBAZEPINE 150 MG PO TABS: 150.0000 mg | ORAL_TABLET | Freq: Three times a day (TID) | ORAL | Status: DC

## 2015-08-02 MED ORDER — HYDROXYZINE HCL 25 MG PO TABS: 25.0000 mg | ORAL_TABLET | Freq: Two times a day (BID) | ORAL | Status: DC | PRN

## 2015-08-02 MED ORDER — PRAZOSIN HCL 2 MG PO CAPS: 2.0000 mg | ORAL_CAPSULE | Freq: Every day | ORAL | Status: DC

## 2015-08-02 MED ORDER — ASENAPINE MALEATE 5 MG SL SUBL: 10.0000 mg | SUBLINGUAL_TABLET | Freq: Every day | SUBLINGUAL | Status: DC

## 2015-08-02 MED ORDER — LIDOCAINE 5 % EX PTCH: 1.0000 | MEDICATED_PATCH | CUTANEOUS | Status: DC

## 2015-08-02 MED ORDER — PANTOPRAZOLE SODIUM 40 MG PO TBEC: 40.0000 mg | DELAYED_RELEASE_TABLET | Freq: Every day | ORAL | Status: DC

## 2015-08-02 NOTE — [facility] Group Notes (Signed)
[facility]  08/02/2015 8:45 AM  Participation Quality: Alert, Appropriate and Oriented  Mood/Affect: Appropriate  Depression Rating: 1  Anxiety Rating: 4  Thoughts of Suicide: Pt denies SI/HI  Will you contract for safety? Yes  Current AVH: Pt denies  Plan for Discharge/Comments: Pt attended discharge planning group and actively participated in group. CSW discussed suicide prevention education with the group and encouraged them to discuss discharge planning and any relevant barriers. Pt reports that he is ready for discharge, only slightly anxious. He expresses feeling much better than admission.  Transportation Means: Pt reports access to transportation  Supports: No supports mentioned at this time  Chad CordialLauren Carter, LCSWA 08/02/2015 9:38 AM

## 2015-08-02 NOTE — Progress Notes (Signed)
Discharge note:  Patient discharged home per MD order.  Patient received all personal belongings and prescriptions.  Medications were reviewed with patient and he indicated understanding.  Follow up instructions were also reviewed and patient was uninterested.  He seemed in a hurry to leave.  He denies SI/HI/AVH.  [facility] amb[facility]  [facility]

## 2015-08-02 NOTE — Discharge Summary (Signed)
Physician Discharge Summary Note  Patient:  Sean Wyatt is an 49 y.o., male MRN:  [id number] DOB:  [birth year] Patient phone:  [phone] (home)  Patient address:   [address] [place] [address],  Total Time spent with patient: 30 minutes  Date of Admission:  07/27/2015 Date of Discharge: 08/02/2015  Reason for Admission:  Depression  Principal Problem: Bipolar I disorder, most recent episode depressed [facility]) Discharge Diagnoses: Patient Active Problem List   Diagnosis Date Noted  . MDD (major depressive disorder), recurrent severe, without psychosis ([facility]) [F33.2] 07/28/2015  . Bipolar I disorder, most recent episode depressed ([facility]) [F31.30] 07/28/2015  . Bipolar mood disorder ([facility]) [F31.9] 07/25/2015  . Depression [F32.9] 07/25/2015  . MDD (major depressive disorder) ([facility]) [F32.9] 07/25/2015    Musculoskeletal: Strength & Muscle Tone: within normal limits Gait & Station: normal Patient leans: N/A  Psychiatric Specialty Exam:  SEE MD SRA Physical Exam  Vitals reviewed. Psychiatric: Thought content is not paranoid and not delusional. He expresses no homicidal and no suicidal ideation. He expresses no suicidal plans and no homicidal plans.    ROS  Blood pressure 159/90, pulse 79, temperature 98.4 F (36.9 C), temperature source Oral, resp. rate 20, height  (1.778 m), weight 96.163 kg (212 lb).Body mass index is 30.42 kg/(m^2).  Have you used any form of tobacco in the last 30 days? (Cigarettes, Smokeless Tobacco, Cigars, and/or Pipes): Yes  Has this patient used any form of tobacco in the last 30 days? (Cigarettes, Smokeless Tobacco, Cigars, and/or Pipes) N/A  Past Medical History:  Past Medical History  Diagnosis Date  . Kidney stones   . Bipolar 1 disorder ([facility])   . Seizures [facility])     Past Surgical History  Procedure Laterality Date  . Amputation    . Hernia repair     Family History: History reviewed. No pertinent family history. Social History:   History  Alcohol Use No     History  Drug Use No    Social History   Social History  . Marital Status: Divorced    Spouse Name: N/A  . Number of Children: N/A  . Years of Education: N/A   Social History Main Topics  . Smoking status: Current Every Day Smoker  . Smokeless tobacco: None  . Alcohol Use: No  . Drug Use: No  . Sexual Activity: Not Currently   Other Topics Concern  . None   Social History Narrative   Risk to Self: Is patient at risk for suicide?: Yes What has been your use of drugs/alcohol within the last 12 months?: none reported Risk to Others:   Prior Inpatient Therapy:   Prior Outpatient Therapy:    Level of Care:  OP  Hospital Course:  Sean Wyatt was admitted for Bipolar I disorder, most recent episode depressed ([facility]) and crisis management.  He was treated discharged with the medications listed below under Medication List.  Medical problems were identified and treated as needed.  Home medications were restarted as appropriate.  Improvement was monitored by observation and Sean Wyatt daily report of symptom reduction.  Emotional and mental status was monitored by daily self-inventory reports completed by Sean Wyatt and clinical staff.         Sean Wyatt was evaluated by the treatment team for stability and plans for continued recovery upon discharge.  Sean Wyatt motivation was an integral factor for scheduling further treatment.  Employment, transportation, bed availability, health status, family support, and any pending legal issues were  also considered during his hospital stay.  He was offered further treatment options upon discharge including but not limited to Residential, Intensive Outpatient, and Outpatient treatment.  Sean Wyatt will follow up with the services as listed below under Follow Up Information.     Upon completion of this admission the patient was both mentally and medically stable for discharge denying  suicidal/homicidal ideation, auditory/visual/tactile hallucinations, delusional thoughts and paranoia.      Consults:  psychiatry  Significant Diagnostic Studies:  labs: per ED  Discharge Vitals:   Blood pressure 159/90, pulse 79, temperature 98.4 F (36.9 C), temperature source Oral, resp. rate 20, height  (1.778 m), weight 96.163 kg (212 lb). Body mass index is 30.42 kg/(m^2). Lab Results:   No results found for this or any previous visit (from the past 72 hour(s)).  Physical Findings: AIMS: Facial and Oral Movements Muscles of Facial Expression: None, normal Lips and Perioral Area: None, normal Jaw: None, normal Tongue: None, normal,Extremity Movements Upper (arms, wrists, hands, fingers): None, normal Lower (legs, knees, ankles, toes): None, normal, Trunk Movements Neck, shoulders, hips: None, normal, Overall Severity Severity of abnormal movements (highest score from questions above): None, normal Incapacitation due to abnormal movements: None, normal Patient's awareness of abnormal movements (rate only patient's report): No Awareness, Dental Status Current problems with teeth and/or dentures?: No Does patient usually wear dentures?: Yes  CIWA:  CIWA-Ar Total: 1 COWS:  COWS Total Score: 1   See Psychiatric Specialty Exam and Suicide Risk Assessment completed by Attending Physician prior to discharge.  Discharge destination:  Home  Is patient on multiple antipsychotic therapies at discharge:  No   Has Patient had three or more failed trials of antipsychotic monotherapy by history:  No    Recommended Plan for Multiple Antipsychotic Therapies: NA     Medication List    STOP taking these medications        clonazePAM 1 MG tablet  Commonly known as:  KLONOPIN     gabapentin 400 MG capsule  Commonly known as:  NEURONTIN     MURINE TEARS FOR DRY EYES OP     oxyCODONE-acetaminophen 5-325 MG tablet  Commonly known as:  PERCOCET/ROXICET     tamsulosin 0.4  MG Caps capsule  Commonly known as:  FLOMAX      TAKE these medications      Indication   asenapine 5 MG Subl 24 hr tablet  Commonly known as:  SAPHRIS  Place 2 tablets (10 mg total) under the tongue at bedtime.   Indication:  Manic-Depression     hydrOXYzine 25 MG tablet  Commonly known as:  ATARAX/VISTARIL  Take 1 tablet (25 mg total) by mouth every 12 (twelve) hours as needed for anxiety.   Indication:  Anxiety Neurosis     lidocaine 5 %  Commonly known as:  LIDODERM  Place 1 patch onto the skin daily. Remove & Discard patch within 12 hours or as directed by MD   Indication:  Allodynia     meloxicam 15 MG tablet  Commonly known as:  MOBIC  Take 1 tablet (15 mg total) by mouth daily.   Indication:  Joint Damage causing Pain and Loss of Function     methocarbamol 750 MG tablet  Commonly known as:  ROBAXIN  Take 1 tablet (750 mg total) by mouth every 8 (eight) hours as needed for muscle spasms.   Indication:  Musculoskeletal Pain     OXcarbazepine 150 MG tablet  Commonly known as:  TRILEPTAL  Take 1 tablet (150 mg total) by mouth 3 (three) times daily.   Indication:  Manic-Depression     pantoprazole 40 MG tablet  Commonly known as:  PROTONIX  Take 1 tablet (40 mg total) by mouth daily.   Indication:  Gastroesophageal Reflux Disease     prazosin 2 MG capsule  Commonly known as:  MINIPRESS  Take 1 capsule (2 mg total) by mouth daily at 6 (six) AM.   Indication:  High Blood Pressure     venlafaxine XR 75 MG 24 hr capsule  Commonly known as:  EFFEXOR-XR  Take 3 capsules (225 mg total) by mouth daily with breakfast.   Indication:  Major Depressive Disorder           Follow-up Information    Follow up with [facility]  On 08/13/2015.   Why:  at 10:00am with Colopos, NP for medication management.   Contact information:   [address] [place] [id number] [phone] Fax: [phone]      Follow-up recommendations:  Activity:  as  tol Diet:  as tol  Comments:  1.  Take all your medications as prescribed.              2.  Report any adverse side effects to outpatient provider.                       3.  Patient instructed to not use alcohol or illegal drugs while on prescription medicines.            4.  In the event of worsening symptoms, instructed patient to call 911, the crisis hotline or go to nearest emergency room for evaluation of symptoms.  Total Discharge Time: 30 min  Signed: Velna HatchetSheila May Agustin AGNP-BC 08/02/2015, 10:13 AM   Patient seen, Suicide Assessment Completed.  Disposition Plan Reviewed

## 2015-08-02 NOTE — Progress Notes (Signed)
  [facility] :  Will you be returning to the same living situation after discharge:  Yes,  [place] At discharge, do you have transportation home?: Yes,  Pt has his own car at the hospital and is stable to transport himself Do you have the ability to pay for your medications: Yes,  Pt provided with prescriptions  Release of information consent forms completed and in the chart;  Patient's signature needed at discharge.  Patient to Follow up at: Follow-up Information    Follow up with [facility]  On 08/13/2015.   Why:  at 10:00am with Colopos, NP for medication management.   Contact information:   [address] [place] [id number] [phone] Fax: [phone]      Patient denies SI/HI: Yes,  Pt denies    Safety Planning and Suicide Prevention discussed: Yes,  with Pt; declines family contact  Have you used any form of tobacco in the last 30 days? (Cigarettes, Smokeless Tobacco, Cigars, and/or Pipes): Yes  Has patient been referred to the Quitline?: Patient refused referral  Elaina HoopsCarter, Brandt Chaney M 08/02/2015, 11:35 AM

## 2015-08-02 NOTE — BHH Suicide Risk Assessment (Signed)
[facility]   Demographic Factors:  49 year old male,  Divorced, 4 adult children, resides in [place]. Currently on disability  Total Time spent with patient: 30 minutes  Musculoskeletal: Strength & Muscle Tone: within normal limits Gait & Station: normal Patient leans: N/A  Psychiatric Specialty Exam: Physical Exam  ROS  Blood pressure 159/90, pulse 79, temperature 98.4 F (36.9 C), temperature source Oral, resp. rate 20, height 5\' 10"  (1.778 m), weight 212 lb (96.163 kg).Body mass index is 30.42 kg/(m^2).  General Appearance: Well Groomed  Eye Contact::  Good  Speech:  Normal Rate409  Volume:  Normal  Mood:  improved, less depressed  Affect:  Appropriate and more reactive. He reports some anxiety  Thought Process:  Linear  Orientation:  Full (Time, Place, and Person)  Thought Content:  denies hallucinations, no delusions, not internally preoccupied   Suicidal Thoughts:  No- currently no suicidal ideations, no self injurious ideations  Homicidal Thoughts:  No  Memory:  recent and remote grossly intact   Judgement:  Other:  improved   Insight:  improved   Psychomotor Activity:  Normal  Concentration:  Good  Recall:  Good  Fund of Knowledge:Good  Language: Good  Akathisia:  Negative  Handed:  Right  AIMS (if indicated):     Assets:  Communication Skills Desire for Improvement Resilience  Sleep:  Number of Hours: 4.25  Cognition: WNL  ADL's:  Intact   Have you used any form of tobacco in the last 30 days? (Cigarettes, Smokeless Tobacco, Cigars, and/or Pipes): Yes  Has this patient used any form of tobacco in the last 30 days? (Cigarettes, Smokeless Tobacco, Cigars, and/or Pipes) Yes, A prescription for an FDA-approved tobacco cessation medication was offered at discharge and the patient refused  Mental Status Per Nursing Assessment::   On Admission:  Suicidal ideation indicated by patient, Suicide plan, Plan includes specific time, place, or  method, Self-harm thoughts  Current Mental Status by Physician: At this  Time patient is improved compared to admission- mood is improved, affect is appropriate, more reactive, no thought disorder, no suicidal ideations, no homicidal ideations, no hallucinations, no delusions, future oriented, looking forward to returning to [place] , see his family and his infant grandchild  Loss Factors: Had stopped taking psychiatric medications, chronic pain, had been travelling out of state , with limited support   Historical Factors: History of Bipolar Disorder, previous psychiatric admissions for depression, history of suicide attempts, history of chronic pain  Risk Reduction Factors:   Sense of responsibility to family, Living with another person, especially a relative, Positive social support and Positive coping skills or problem solving skills  Continued Clinical Symptoms:  As noted , currently much improved compared to admission- no current SI, no HI, no psychotic symptoms and future oriented.  At this time denies medication side effects.  Cognitive Features That Contribute To Risk:  No gross cognitive deficits noted upon discharge. Is alert , attentive, and oriented x 3   Suicide Risk:  Mild:  Suicidal ideation of limited frequency, intensity, duration, and specificity.  There are no identifiable plans, no associated intent, mild dysphoria and related symptoms, good self-control (both objective and subjective assessment), few other risk factors, and identifiable protective factors, including available and accessible social support.  Principal Problem: Bipolar I disorder, most recent episode depressed [facility](HCC) Discharge Diagnoses:  Patient Active Problem List   Diagnosis Date Noted  . MDD (major depressive disorder), recurrent severe, without psychosis (HCC) [F33.2] 07/28/2015  .  Bipolar I disorder, most recent episode depressed (HCC) [F31.30] 07/28/2015  . Bipolar mood disorder (HCC) [F31.9]  07/25/2015  . Depression [F32.9] 07/25/2015  . MDD (major depressive disorder) (HCC) [F32.9] 07/25/2015    Follow-up Information    Follow up with [facility]  On 08/13/2015.   Why:  at 10:00am with Col[place], NP for medication management.   Contact information:   [address] [place] [address] [phone] Fax: [phone]      Plan Of Care/Follow-up recommendations:  Activity:  as tolerated  Diet:  Regular  Tests:  NA Other:  See below   Is patient on multiple antipsychotic therapies at discharge:  No    Do you recommend tapering to monotherapy for antipsychotics?  No   Has Patient had three or more failed trials of antipsychotic monotherapy by history:  No  Recommended Plan for Multiple Antipsychotic Therapies: NA  Patient is leaving unit in good spirits. Plans to return to [facility]. Follow up as above . 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 Plans to return to pain clinic for pain management in [place]- states that at this time his goal is to stay off opiates if possible .  Sean Wyatt 08/02/2015, 10:12 AM

## 2015-08-03 ENCOUNTER — Emergency Department
Admission: EM | Admit: 2015-08-03 | Discharge: 2015-08-06 | Disposition: A | Discharge status: Other Acute Inpatient Hospital | Payer: Medicare Other | Attending: Emergency Medicine | Admitting: Emergency Medicine

## 2015-08-03 DIAGNOSIS — Z008 Encounter for other general examination: Secondary | ICD-10-CM | POA: Diagnosis present

## 2015-08-03 DIAGNOSIS — F332 Major depressive disorder, recurrent severe without psychotic features: Secondary | ICD-10-CM | POA: Diagnosis not present

## 2015-08-03 DIAGNOSIS — F3130 Bipolar disorder, current episode depressed, mild or moderate severity, unspecified: Secondary | ICD-10-CM | POA: Diagnosis present

## 2015-08-03 DIAGNOSIS — G8929 Other chronic pain: Secondary | ICD-10-CM | POA: Insufficient documentation

## 2015-08-03 DIAGNOSIS — R45851 Suicidal ideations: Secondary | ICD-10-CM

## 2015-08-03 DIAGNOSIS — Z720 Tobacco use: Secondary | ICD-10-CM | POA: Diagnosis not present

## 2015-08-03 DIAGNOSIS — Z79899 Other long term (current) drug therapy: Secondary | ICD-10-CM | POA: Diagnosis not present

## 2015-08-03 DIAGNOSIS — F419 Anxiety disorder, unspecified: Secondary | ICD-10-CM | POA: Diagnosis not present

## 2015-08-03 DIAGNOSIS — Z791 Long term (current) use of non-steroidal anti-inflammatories (NSAID): Secondary | ICD-10-CM | POA: Diagnosis not present

## 2015-08-03 DIAGNOSIS — M542 Cervicalgia: Secondary | ICD-10-CM | POA: Diagnosis not present

## 2015-08-03 HISTORY — DX: Post-traumatic stress disorder, unspecified: F43.10

## 2015-08-03 LAB — SALICYLATE LEVEL: Salicylate Lvl: <4 mg/dL (ref 2.8–30.0)

## 2015-08-03 LAB — COMPREHENSIVE METABOLIC PANEL
ALT: 139 U/L — AB (ref 17–63)
AST: 107 U/L — ABNORMAL HIGH (ref 15–41)
Albumin: 4.4 g/dL (ref 3.5–5.0)
Alkaline Phosphatase: 59 U/L (ref 38–126)
Anion gap: 9 (ref 5–15)
BUN: 15 mg/dL (ref 6–20)
CALCIUM: 9.2 mg/dL (ref 8.9–10.3)
CHLORIDE: 104 mmol/L (ref 101–111)
CO2: 27 mmol/L (ref 22–32)
Creatinine, Ser: 0.82 mg/dL (ref 0.61–1.24)
GLUCOSE: 123 mg/dL — AB (ref 65–99)
POTASSIUM: 4.1 mmol/L (ref 3.5–5.1)
Sodium: 140 mmol/L (ref 135–145)
TOTAL PROTEIN: 8.1 g/dL (ref 6.5–8.1)
Total Bilirubin: 0.4 mg/dL (ref 0.3–1.2)

## 2015-08-03 LAB — CBC
HEMATOCRIT: 47.1 % (ref 40.0–52.0)
HEMOGLOBIN: 16.1 g/dL (ref 13.0–18.0)
MCH: 32 pg (ref 26.0–34.0)
MCHC: 34.1 g/dL (ref 32.0–36.0)
MCV: 93.7 fL (ref 80.0–100.0)
Platelets: 287 10*3/uL (ref 150–440)
RBC: 5.02 MIL/uL (ref 4.40–5.90)
RDW: 12.9 % (ref 11.5–14.5)
WBC: 9.8 10*3/uL (ref 3.8–10.6)

## 2015-08-03 LAB — URINE DRUG SCREEN, QUALITATIVE (ARMC ONLY)
AMPHETAMINES, UR SCREEN: NOT DETECTED
Barbiturates, Ur Screen: NOT DETECTED
Benzodiazepine, Ur Scrn: POSITIVE — AB
COCAINE METABOLITE, UR ~~LOC~~: NOT DETECTED
Cannabinoid 50 Ng, Ur ~~LOC~~: NOT DETECTED
MDMA (Ecstasy)Ur Screen: NOT DETECTED
METHADONE SCREEN, URINE: NOT DETECTED
OPIATE, UR SCREEN: NOT DETECTED
PHENCYCLIDINE (PCP) UR S: NOT DETECTED
Tricyclic, Ur Screen: POSITIVE — AB

## 2015-08-03 LAB — ACETAMINOPHEN LEVEL

## 2015-08-03 LAB — ETHANOL: Alcohol, Ethyl (B): <5 mg/dL (ref <5)

## 2015-08-03 MED ORDER — LORAZEPAM 1 MG PO TABS
1.0000 mg | ORAL_TABLET | Freq: Once | ORAL | Status: AC
  Administered 2015-08-03: 1 mg via ORAL
  Filled 2015-08-03: qty 1

## 2015-08-03 MED ORDER — VENLAFAXINE HCL ER 75 MG PO CP24
225.0000 mg | ORAL_CAPSULE | Freq: Every day | ORAL | Status: DC
  Administered 2015-08-04: 225 mg via ORAL
  Filled 2015-08-03 (×2): qty 3

## 2015-08-03 MED ORDER — NAPROXEN 500 MG PO TABS
500.0000 mg | ORAL_TABLET | Freq: Once | ORAL | Status: AC
  Administered 2015-08-03: 500 mg via ORAL
  Filled 2015-08-03: qty 1

## 2015-08-03 MED ORDER — NICOTINE 21 MG/24HR TD PT24
21.0000 mg | MEDICATED_PATCH | Freq: Once | TRANSDERMAL | Status: AC
  Administered 2015-08-03: 21 mg via TRANSDERMAL
  Filled 2015-08-03 (×3): qty 1

## 2015-08-03 MED ORDER — HYDROXYZINE HCL 25 MG PO TABS
25.0000 mg | ORAL_TABLET | Freq: Two times a day (BID) | ORAL | Status: DC | PRN
  Administered 2015-08-04 – 2015-08-05 (×3): 25 mg via ORAL
  Filled 2015-08-03 (×3): qty 1

## 2015-08-03 MED ORDER — ASENAPINE MALEATE 5 MG SL SUBL
10.0000 mg | SUBLINGUAL_TABLET | Freq: Every day | SUBLINGUAL | Status: DC
  Administered 2015-08-04 – 2015-08-05 (×3): 10 mg via SUBLINGUAL
  Filled 2015-08-03 (×5): qty 2

## 2015-08-03 MED ORDER — METHOCARBAMOL 500 MG PO TABS
750.0000 mg | ORAL_TABLET | Freq: Three times a day (TID) | ORAL | Status: DC | PRN
  Administered 2015-08-04 – 2015-08-05 (×3): 750 mg via ORAL
  Filled 2015-08-03 (×4): qty 2

## 2015-08-03 MED ORDER — OXCARBAZEPINE 150 MG PO TABS
150.0000 mg | ORAL_TABLET | Freq: Three times a day (TID) | ORAL | Status: DC
  Administered 2015-08-04 – 2015-08-05 (×6): 150 mg via ORAL
  Filled 2015-08-03 (×11): qty 1

## 2015-08-03 MED ORDER — PRAZOSIN HCL 2 MG PO CAPS
2.0000 mg | ORAL_CAPSULE | Freq: Every day | ORAL | Status: DC
  Administered 2015-08-04 – 2015-08-05 (×2): 2 mg via ORAL
  Filled 2015-08-03 (×4): qty 1

## 2015-08-03 MED ORDER — MELOXICAM 15 MG PO TABS
15.0000 mg | ORAL_TABLET | Freq: Every day | ORAL | Status: DC
  Filled 2015-08-03: qty 1

## 2015-08-03 MED ORDER — PANTOPRAZOLE SODIUM 40 MG PO TBEC
40.0000 mg | DELAYED_RELEASE_TABLET | Freq: Every day | ORAL | Status: DC
  Administered 2015-08-04 – 2015-08-05 (×2): 40 mg via ORAL
  Filled 2015-08-03 (×2): qty 1

## 2015-08-03 NOTE — ED Notes (Signed)
Pt c/o SI with plan of wrecking car. Pt states released yesterday from [facility]. Pt states increased anxiety. Denies HI
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
## 2015-08-03 NOTE — BHH Counselor (Signed)
Per Dr. Guss [place] Pt is to be observed overnight and evaluated by Psych MD in the morning.
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
## 2015-08-04 DIAGNOSIS — F332 Major depressive disorder, recurrent severe without psychotic features: Secondary | ICD-10-CM | POA: Diagnosis not present

## 2015-08-04 MED ORDER — CLONAZEPAM 0.5 MG PO TABS
0.5000 mg | ORAL_TABLET | Freq: Once | ORAL | Status: AC
  Administered 2015-08-04: 0.5 mg via ORAL

## 2015-08-04 MED ORDER — CLONAZEPAM 0.5 MG PO TABS
0.5000 mg | ORAL_TABLET | Freq: Two times a day (BID) | ORAL | Status: DC
  Filled 2015-08-04: qty 1

## 2015-08-04 MED ORDER — VENLAFAXINE HCL ER 150 MG PO CP24
150.0000 mg | ORAL_CAPSULE | Freq: Two times a day (BID) | ORAL | Status: DC
  Administered 2015-08-04: 150 mg via ORAL
  Filled 2015-08-04 (×3): qty 1

## 2015-08-04 MED ORDER — IBUPROFEN 100 MG/5ML PO SUSP
800.0000 mg | Freq: Three times a day (TID) | ORAL | Status: DC | PRN
  Filled 2015-08-04: qty 40

## 2015-08-04 MED ORDER — IBUPROFEN 800 MG PO TABS
ORAL_TABLET | ORAL | Status: AC
  Administered 2015-08-04: 800 mg
  Filled 2015-08-04: qty 1

## 2015-08-04 MED ORDER — CLONAZEPAM 0.5 MG PO TABS: 0.5000 mg | ORAL_TABLET | Freq: Two times a day (BID) | ORAL | Status: DC

## 2015-08-04 MED ORDER — NICOTINE 21 MG/24HR TD PT24
21.0000 mg | MEDICATED_PATCH | Freq: Once | TRANSDERMAL | Status: AC
  Administered 2015-08-04: 21 mg via TRANSDERMAL

## 2015-08-04 MED ORDER — VENLAFAXINE HCL ER 75 MG PO CP24
ORAL_CAPSULE | ORAL | Status: AC
  Administered 2015-08-04: 225 mg via ORAL
  Filled 2015-08-04: qty 3

## 2015-08-04 MED ORDER — IBUPROFEN 800 MG PO TABS
800.0000 mg | ORAL_TABLET | Freq: Three times a day (TID) | ORAL | Status: DC | PRN
  Administered 2015-08-05: 800 mg via ORAL
  Filled 2015-08-04: qty 1

## 2015-08-04 MED ORDER — ONDANSETRON HCL 4 MG PO TABS
4.0000 mg | ORAL_TABLET | Freq: Three times a day (TID) | ORAL | Status: DC | PRN
  Administered 2015-08-04: 4 mg via ORAL
  Filled 2015-08-04: qty 1

## 2015-08-04 NOTE — ED Notes (Signed)
Pharmacy called for ordered medications

## 2015-08-04 NOTE — ED Notes (Signed)
BEHAVIORAL HEALTH ROUNDING Patient sleeping: No. Patient alert and oriented: yes Behavior appropriate: Yes.  ; If no, describe:  Nutrition and fluids offered: Yes  Toileting and hygiene offered: Yes  Sitter present: no Law enforcement present: Yes  

## 2015-08-04 NOTE — ED Provider Notes (Signed)
-----------------------------------------   7:14 AM on 08/04/2015 -----------------------------------------   Blood pressure 148/75, pulse 94, temperature 98.2 F (36.8 C), temperature source Oral, resp. rate 20, height 6' (1.829 m), weight 212 lb (96.163 kg), SpO2 100 %.  The patient had no acute events since last update.  Calm and cooperative at this time.  Disposition is pending per Psychiatry/Behavioral Medicine team recommendations.     Irean HongJade J Ariah Mower, MD 08/04/15 [phone]

## 2015-08-04 NOTE — ED Notes (Signed)
Pt resting quietly; medications arrived from pharmacy

## 2015-08-04 NOTE — ED Notes (Signed)
Pharmacy called for ordered medication 

## 2015-08-04 NOTE — ED Notes (Signed)

## 2015-08-04 NOTE — Consult Note (Signed)
[facility] Consult   Reason for Consult:  Follow up Referring Physician:  Er Patient Identification: Sean Wyatt MRN:  [id number] Principal Diagnosis: <principal problem not specified> Diagnosis:   Patient Active Problem List   Diagnosis Date Noted  . MDD (major depressive disorder), recurrent severe, without psychosis ([place]) [F33.2] 07/28/2015  . Bipolar I disorder, most recent episode depressed ([place]) [F31.30] 07/28/2015  . Bipolar mood disorder ([place]) [F31.9] 07/25/2015  . Depression [F32.9] 07/25/2015  . MDD (major depressive disorder) ([place]) [F32.9] 07/25/2015    Total Time spent with patient: 45 minutes  Subjective:   Sean Wyatt is a 49 y.o. male patient admitted with depression and suicidal ideas of wanting to run his car/.  HPI:  Pt has long H/O depression and is being followed in [place] by [facility]. He came on vacation to [place] to visit family and friends and been depressed for 2 wks for no reason and has been complaint with meds.  Past Psychiatric History: depression and anxiety  Risk to Self: Suicidal Ideation: Yes-Currently Present Suicidal Intent: Yes-Currently Present Is patient at risk for suicide?: Yes Suicidal Plan?: Yes-Currently Present Specify Current Suicidal Plan: "Crash my car" Access to Means: Yes Specify Access to Suicidal Means: Access to Car What has been your use of drugs/alcohol within the last 12 months?: Denies How many times?: 4 Other Self Harm Risks: Previous suicide attempts Triggers for Past Attempts: Other (Comment) (Depression) Intentional Self Injurious Behavior: None Risk to Others: Homicidal Ideation: No Thoughts of Harm to Others: No Current Homicidal Intent: No Current Homicidal Plan: No Access to Homicidal Means: No Identified Victim: NA History of harm to others?: No Assessment of Violence: None Noted Violent Behavior Description: NA Does patient have access to weapons?: No Criminal Charges Pending?: No Does  patient have a court date: No Prior Inpatient Therapy: Prior Inpatient Therapy: Yes Prior Therapy Dates: Multiple admissions "started when I was 10" Reason for Treatment: SI Prior Outpatient Therapy: Prior Outpatient Therapy: Yes Prior Therapy Dates: Not Provided Prior Therapy Facilty/Provider(s): Facility located in [place] Reason for Treatment: Not Provided Does patient have an ACCT team?: No Does patient have Intensive In-House Services?  : No Does patient have [facility] services? : No Does patient have P4CC services?: No  Past Medical History:  Past Medical History  Diagnosis Date  . Kidney stones   . Bipolar 1 disorder ([place])   . Seizures ([place])   . PTSD (post-traumatic stress disorder)     Past Surgical History  Procedure Laterality Date  . Amputation    . Hernia repair    . Below knee leg amputation     Family History: History reviewed. No pertinent family history. Family Psychiatric  History: none Social History:  History  Alcohol Use No     History  Drug Use No    Social History   Social History  . Marital Status: Divorced    Spouse Name: N/A  . Number of Children: N/A  . Years of Education: N/A   Social History Main Topics  . Smoking status: Current Every Day Smoker  . Smokeless tobacco: None  . Alcohol Use: No  . Drug Use: No  . Sexual Activity: Not Currently   Other Topics Concern  . None   Social History Narrative   Additional Social History:                          Allergies:   Allergies  Allergen Reactions  .  Clindamycin/Lincomycin Diarrhea and Nausea And Vomiting  . Gabapentin Other (See Comments)    Tremors   . Seroquel [Quetiapine] Other (See Comments)    "Makes me climb the walls"   . Trazamine [Trazodone & Diet Manage Prod] Other (See Comments)    "Makes me climb the walls"     Labs:  Results for orders placed or performed during the hospital encounter of 08/03/15 (from the past 48 hour(s))  Comprehensive metabolic  panel     Status: Abnormal   Collection Time: 08/03/15  6:44 PM  Result Value Ref Range   Sodium 140 135 - 145 mmol/L   Potassium 4.1 3.5 - 5.1 mmol/L   Chloride 104 101 - 111 mmol/L   CO2 27 22 - 32 mmol/L   Glucose, Bld 123 (H) 65 - 99 mg/dL   BUN 15 6 - 20 mg/dL   Creatinine, Ser 0.82 0.61 - 1.24 mg/dL   Calcium 9.2 8.9 - 10.3 mg/dL   Total Protein 8.1 6.5 - 8.1 g/dL   Albumin 4.4 3.5 - 5.0 g/dL   AST 107 (H) 15 - 41 U/L   ALT 139 (H) 17 - 63 U/L   Alkaline Phosphatase 59 38 - 126 U/L   Total Bilirubin 0.4 0.3 - 1.2 mg/dL   GFR calc non Af Amer >60 >60 mL/min   GFR calc Af Amer >60 >60 mL/min    Comment: (NOTE) The eGFR has been calculated using the CKD EPI equation. This calculation has not been validated in all clinical situations. eGFR's persistently <60 mL/min signify possible Chronic Kidney Disease.    Anion gap 9 5 - 15  Ethanol (ETOH)     Status: None   Collection Time: 08/03/15  6:44 PM  Result Value Ref Range   Alcohol, Ethyl (B) <5 <5 mg/dL    Comment:        LOWEST DETECTABLE LIMIT FOR SERUM ALCOHOL IS 5 mg/dL FOR MEDICAL PURPOSES ONLY   Salicylate level     Status: None   Collection Time: 08/03/15  6:44 PM  Result Value Ref Range   Salicylate Lvl <5.1 2.8 - 30.0 mg/dL  Acetaminophen level     Status: Abnormal   Collection Time: 08/03/15  6:44 PM  Result Value Ref Range   Acetaminophen (Tylenol), Serum <10 (L) 10 - 30 ug/mL    Comment:        THERAPEUTIC CONCENTRATIONS VARY SIGNIFICANTLY. A RANGE OF 10-30 ug/mL MAY BE AN EFFECTIVE CONCENTRATION FOR MANY PATIENTS. HOWEVER, SOME ARE BEST TREATED AT CONCENTRATIONS OUTSIDE THIS RANGE. ACETAMINOPHEN CONCENTRATIONS >150 ug/mL AT 4 HOURS AFTER INGESTION AND >50 ug/mL AT 12 HOURS AFTER INGESTION ARE OFTEN ASSOCIATED WITH TOXIC REACTIONS.   CBC     Status: None   Collection Time: 08/03/15  6:44 PM  Result Value Ref Range   WBC 9.8 3.8 - 10.6 K/uL   RBC 5.02 4.40 - 5.90 MIL/uL   Hemoglobin 16.1 13.0  - 18.0 g/dL   HCT 47.1 40.0 - 52.0 %   MCV 93.7 80.0 - 100.0 fL   MCH 32.0 26.0 - 34.0 pg   MCHC 34.1 32.0 - 36.0 g/dL   RDW 12.9 11.5 - 14.5 %   Platelets 287 150 - 440 K/uL  Urine Drug Screen, Qualitative ([place] only)     Status: Abnormal   Collection Time: 08/03/15  6:44 PM  Result Value Ref Range   Tricyclic, Ur Screen POSITIVE (A) NONE DETECTED   Amphetamines, Ur Screen NONE DETECTED NONE DETECTED  MDMA (Ecstasy)Ur Screen NONE DETECTED NONE DETECTED   Cocaine Metabolite,Ur [place] NONE DETECTED NONE DETECTED   Opiate, Ur Screen NONE DETECTED NONE DETECTED   Phencyclidine (PCP) Ur S NONE DETECTED NONE DETECTED   Cannabinoid 50 Ng, Ur [place] NONE DETECTED NONE DETECTED   Barbiturates, Ur Screen NONE DETECTED NONE DETECTED   Benzodiazepine, Ur Scrn POSITIVE (A) NONE DETECTED   Methadone Scn, Ur NONE DETECTED NONE DETECTED    Comment: (NOTE) 161  Tricyclics, urine               Cutoff 1000 ng/mL 200  Amphetamines, urine             Cutoff 1000 ng/mL 300  MDMA (Ecstasy), urine           Cutoff 500 ng/mL 400  Cocaine Metabolite, urine       Cutoff 300 ng/mL 500  Opiate, urine                   Cutoff 300 ng/mL 600  Phencyclidine (PCP), urine      Cutoff 25 ng/mL 700  Cannabinoid, urine              Cutoff 50 ng/mL 800  Barbiturates, urine             Cutoff 200 ng/mL 900  Benzodiazepine, urine           Cutoff 200 ng/mL 1000 Methadone, urine                Cutoff 300 ng/mL 1100 1200 The urine drug screen provides only a preliminary, unconfirmed 1300 analytical test result and should not be used for non-medical 1400 purposes. Clinical consideration and professional judgment should 1500 be applied to any positive drug screen result due to possible 1600 interfering substances. A more specific alternate chemical method 1700 must be used in order to obtain a confirmed analytical result.  1800 Gas chromato graphy / mass spectrometry (GC/MS) is the preferred 1900 confirmatory method.      Current Facility-Administered Medications  Medication Dose Route Frequency Provider Last Rate Last Dose  . asenapine (SAPHRIS) sublingual tablet 10 mg  10 mg Sublingual QHS Carrie Mew, MD   10 mg at 08/04/15 0106  . hydrOXYzine (ATARAX/VISTARIL) tablet 25 mg  25 mg Oral Q12H PRN Carrie Mew, MD   25 mg at 08/04/15 0802  . ibuprofen (ADVIL,MOTRIN) tablet 800 mg  800 mg Oral TID PRN Lisa Roca, MD      . methocarbamol (ROBAXIN) tablet 750 mg  750 mg Oral Q8H PRN Carrie Mew, MD   750 mg at 08/04/15 0803  . nicotine (NICODERM CQ - dosed in mg/24 hours) patch 21 mg  21 mg Transdermal Once Carrie Mew, MD   21 mg at 08/03/15 2343  . OXcarbazepine (TRILEPTAL) tablet 150 mg  150 mg Oral TID Carrie Mew, MD   150 mg at 08/04/15 1614  . pantoprazole (PROTONIX) EC tablet 40 mg  40 mg Oral Daily Carrie Mew, MD   40 mg at 08/04/15 1010  . prazosin (MINIPRESS) capsule 2 mg  2 mg Oral Q0600 Carrie Mew, MD   2 mg at 08/04/15 0960  . venlafaxine XR (EFFEXOR-XR) 24 hr capsule 225 mg  225 mg Oral Q breakfast Carrie Mew, MD   225 mg at 08/04/15 0803   Current Outpatient Prescriptions  Medication Sig Dispense Refill  . asenapine (SAPHRIS) 5 MG SUBL 24 hr tablet Place 2 tablets (10 mg total) under the tongue  at bedtime. 60 tablet 0  . hydrOXYzine (ATARAX/VISTARIL) 25 MG tablet Take 1 tablet (25 mg total) by mouth every 12 (twelve) hours as needed for anxiety. 30 tablet 0  . methocarbamol (ROBAXIN) 750 MG tablet Take 1 tablet (750 mg total) by mouth every 8 (eight) hours as needed for muscle spasms. 30 tablet 0  . OXcarbazepine (TRILEPTAL) 150 MG tablet Take 1 tablet (150 mg total) by mouth 3 (three) times daily. 90 tablet 0  . pantoprazole (PROTONIX) 40 MG tablet Take 1 tablet (40 mg total) by mouth daily. 30 tablet 0  . prazosin (MINIPRESS) 2 MG capsule Take 1 capsule (2 mg total) by mouth daily at 6 (six) AM. 30 capsule 0  . venlafaxine XR (EFFEXOR-XR) 75 MG 24 hr  capsule Take 3 capsules (225 mg total) by mouth daily with breakfast. 90 capsule 0  . lidocaine (LIDODERM) 5 % Place 1 patch onto the skin daily. Remove & Discard patch within 12 hours or as directed by MD 30 patch 0  . meloxicam (MOBIC) 15 MG tablet Take 1 tablet (15 mg total) by mouth daily. 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   Blood pressure 114/73, pulse 86, temperature 97.5 F (36.4 C), temperature source Oral, resp. rate 20, height 6' (1.829 m), weight 212 lb (96.163 kg), SpO2 95 %.Body mass index is 28.75 kg/(m^2).  General Appearance: Casual  Eye Contact::  Fair  Speech:  Clear and Coherent  Volume:  Normal  Mood:  Anxious, Depressed and adequate  Affect:  Appropriate  Thought Process:  Circumstantial  Orientation:  Full (Time, Place, and Person)  Thought Content:  NA and Rumination  Suicidal Thoughts:  Yes.  with intent/plan"wants to run off the car."  Homicidal Thoughts:  No  Memory:  Immediate;   Fair Recent;   Fair Remote;   Fair adequate.  Judgement:  Fair  Insight:  Lacking  Psychomotor Activity:  Normal  Concentration:  Fair  Recall:  [employer] of Knowledge:Fair  Language: Fair  Akathisia:  No  Handed:  Right  AIMS (if indicated):     Assets:  Communication Skills  ADL's:  Intact  Cognition: WNL  Sleep:      Treatment Plan Summary: Plan Continue and adjust meds. Pt was dischrged from [facility] few days ago after being stabilized and he is to be re-eval in Am of 08/05/2015 for appropriate disposition planning  Disposition: No evidence of imminent risk to self or others at present.  though he stated he wants to run off the car.  Dewain Penning 08/04/2015 5:15 PM

## 2015-08-04 NOTE — ED Notes (Signed)
Pt c/o feeling agitated/anxious and states he normally takes klonopin for anxiety TID PRN. MD aware..Marland Kitchen

## 2015-08-05 DIAGNOSIS — R45851 Suicidal ideations: Secondary | ICD-10-CM

## 2015-08-05 DIAGNOSIS — F3130 Bipolar disorder, current episode depressed, mild or moderate severity, unspecified: Secondary | ICD-10-CM

## 2015-08-05 DIAGNOSIS — F332 Major depressive disorder, recurrent severe without psychotic features: Secondary | ICD-10-CM | POA: Diagnosis not present

## 2015-08-05 MED ORDER — CLONAZEPAM 0.5 MG PO TABS
0.5000 mg | ORAL_TABLET | Freq: Three times a day (TID) | ORAL | Status: DC | PRN
  Administered 2015-08-05: 0.5 mg via ORAL

## 2015-08-05 MED ORDER — CLONAZEPAM 0.5 MG PO TABS
1.0000 mg | ORAL_TABLET | Freq: Three times a day (TID) | ORAL | Status: DC
  Administered 2015-08-05: 1 mg via ORAL
  Filled 2015-08-05: qty 2

## 2015-08-05 MED ORDER — CLONAZEPAM 0.5 MG PO TABS
ORAL_TABLET | ORAL | Status: AC
  Filled 2015-08-05: qty 1

## 2015-08-05 MED ORDER — VENLAFAXINE HCL ER 75 MG PO CP24
150.0000 mg | ORAL_CAPSULE | Freq: Every day | ORAL | Status: DC
  Filled 2015-08-05 (×2): qty 2

## 2015-08-05 MED ORDER — CLONAZEPAM 0.5 MG PO TABS
0.5000 mg | ORAL_TABLET | Freq: Every day | ORAL | Status: DC
  Administered 2015-08-05: 0.5 mg via ORAL
  Filled 2015-08-05: qty 1

## 2015-08-05 MED ORDER — PROMETHAZINE HCL 25 MG PO TABS
50.0000 mg | ORAL_TABLET | Freq: Once | ORAL | Status: AC
  Administered 2015-08-05: 50 mg via ORAL
  Filled 2015-08-05: qty 2

## 2015-08-05 MED ORDER — CLONAZEPAM 0.5 MG PO TABS
1.0000 mg | ORAL_TABLET | ORAL | Status: DC
  Administered 2015-08-05: 1 mg via ORAL
  Filled 2015-08-05: qty 2

## 2015-08-05 MED ORDER — NICOTINE 21 MG/24HR TD PT24
21.0000 mg | MEDICATED_PATCH | Freq: Once | TRANSDERMAL | Status: DC
  Filled 2015-08-05: qty 1

## 2015-08-05 NOTE — ED Notes (Signed)
Attempted to call report for the pt going to Rm 319 in the Behavioral Unit on the lower level. UC/secretary stated the RN had just stepped out and would call back for report when ready to accept the pt.

## 2015-08-05 NOTE — ED Provider Notes (Signed)
-----------------------------------------   11:11 PM on 08/05/2015 -----------------------------------------  Patient has been seen and evaluated by psychiatry. They'll be admitting to their service for further treatment and evaluation.  Minna AntisKevin Asar Evilsizer, MD 08/05/15 [phone]

## 2015-08-05 NOTE — B[facility] Counselor (Signed)
[facility] by Dr. Toni Amendlapacs. Attending Physician will be Dr. Jennet MaduroPucilowska.  Pt. has been assigned to room 319, by [facility] Charge Nurse Everardo PacificKenisha.  Intake Paper Work has been signed and placed on pt. chart. ER staff ( nitchiaER Sect.; Dr. Deberah CastlePaduchoski , ER MD; Dora SimsSonjia Patient's Nurse & Nicole Cellaorothy Patient Access) have been made aware of the admission.

## 2015-08-05 NOTE — ED Notes (Signed)
BEHAVIORAL HEALTH ROUNDING Patient sleeping: No. Patient alert and oriented: yes Behavior appropriate: Yes.  ; If no, describe:  Nutrition and fluids offered: Yes  Toileting and hygiene offered: Yes  Sitter present: yes Law enforcement present: Yes  

## 2015-08-05 NOTE — ED Notes (Signed)
Pt medicated as requested for c/o anxiety; sitting up in bed watching tv

## 2015-08-05 NOTE — Consult Note (Signed)
[facility] Face-to-Face Psychiatry Consult   Reason for Consult:  Consult for this 49 year old man with a history of mood instability. Chief complaint "racing thoughts, suicidal, depression" Referring Physician:  Paduchowski Patient Identification: Sean Wyatt MRN:  [id number] Principal Diagnosis: Bipolar I disorder, most recent episode depressed ([place]) Diagnosis:   Patient Active Problem List   Diagnosis Date Noted  . Suicidal ideation [R45.851] 08/05/2015  . MDD (major depressive disorder), recurrent severe, without psychosis ([place]) [F33.2] 07/28/2015  . Bipolar I disorder, most recent episode depressed ([place]) [F31.30] 07/28/2015  . Bipolar mood disorder ([place]) [F31.9] 07/25/2015  . Depression [F32.9] 07/25/2015  . MDD (major depressive disorder) ([place]) [F32.9] 07/25/2015    Total Time spent with patient: 1 hour  Subjective:   Sean Wyatt is a 49 y.o. male patient admitted with "I've been having racing thoughts been suicidal and depressed".  HPI:  Information from the patient and the chart. Patient interviewed. Chart reviewed. Old notes reviewed. Labs reviewed. Case discussed with psychiatry staff in the emergency room. 49 year old man brought himself voluntarily to the emergency room with a statement that he was having thoughts of killing himself. He was just discharged from [place] inpatient unit about a day previously but as soon as he got out of the hospital found that his mood was unstable again. He was having thoughts about running his car off the road and trying to kill himself. Mood is been racing sleep is been poor. This is despite his having been restarted on his psychiatric medicine. He denies that he didn't actively abusing any alcohol or drugs. Major social stress is that he is a long way from home really doesn't have anybody supportive here in the community he was passing through on his way to visit family.  Social history: Patient lives in [place] where he has regular outpatient  treatment. He apparently is on a long car trip up through [place] into the [facility] and back down again. He doesn't have any family here in [place] is closest relatives in [place] are in [place]. Patient isn't not married but has children who are living independently now. Does sound like he has a reasonable relationship with his extended family.  Medical history: History of a herniated disc. 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  Substance abuse history: States that he drinks only infrequently now. 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  Current medications: See the full intake listed appears to be a combination of Saphris, Trileptal, Effexor, clonazepam and Vistaril  Past Psychiatric History: Patient states that he's had mental health problems since he was a child with his first hospitalization at age 50. Sounds like it's pretty typical for him to have several hospitalizations a year. Multiple suicide attempts. Has been on a wide range of medicines both antidepressants as well as mood stabilizers.    Risk to Self: Suicidal Ideation: Yes-Currently Present Suicidal Intent: Yes-Currently Present Is patient at risk for suicide?: Yes Suicidal Plan?: Yes-Currently Present Specify Current Suicidal Plan: "Crash my car" Access to Means: Yes Specify Access to Suicidal Means: Access to Car What has been your use of drugs/alcohol within the last 12 months?: Denies How many times?: 4 Other Self Harm Risks: Previous suicide attempts Triggers for Past Attempts: Other (Comment) (Depression) Intentional Self Injurious Behavior: None Risk to Others: Homicidal Ideation: No Thoughts of Harm to Others: No Current Homicidal Intent: No Current Homicidal Plan: No Access to Homicidal Means:  No Identified Victim: NA History of harm to others?: No Assessment of Violence: None Noted Violent Behavior Description:  NA Does patient have access to weapons?: No Criminal Charges Pending?: No Does patient have a court date: No Prior Inpatient Therapy: Prior Inpatient Therapy: Yes Prior Therapy Dates: Multiple admissions "started when I was 10" Reason for Treatment: SI Prior Outpatient Therapy: Prior Outpatient Therapy: Yes Prior Therapy Dates: Not Provided Prior Therapy Facilty/Provider(s): Facility located in [place] Reason for Treatment: Not Provided Does patient have an ACCT team?: No Does patient have Intensive In-House Services?  : No Does patient have [facility] services? : No Does patient have P4CC services?: No  Past Medical History:  Past Medical History  Diagnosis Date  . Kidney stones   . Bipolar 1 disorder ([place])   . Seizures ([place])   . PTSD (post-traumatic stress disorder)     Past Surgical History  Procedure Laterality Date  . Amputation    . Hernia repair    . Below knee leg amputation     Family History: History reviewed. No pertinent family history. Family Psychiatric  History: Patient states that his mother had bipolar disorder and had made suicide attempts. No other reported family history Social History:  History  Alcohol Use No     History  Drug Use No    Social History   Social History  . Marital Status: Divorced    Spouse Name: N/A  . Number of Children: N/A  . Years of Education: N/A   Social History Main Topics  . Smoking status: Current Every Day Smoker  . Smokeless tobacco: None  . Alcohol Use: No  . Drug Use: No  . Sexual Activity: Not Currently   Other Topics Concern  . None   Social History Narrative   Additional Social History:                          Allergies:   Allergies  Allergen Reactions  . Clindamycin/Lincomycin Diarrhea and Nausea And Vomiting  . Gabapentin Other (See Comments)    Tremors   . Seroquel [Quetiapine] Other (See Comments)    "Makes me climb the walls"   . Trazamine [Trazodone & Diet Manage Prod] Other (See  Comments)    "Makes me climb the walls"     Labs:  Results for orders placed or performed during the hospital encounter of 08/03/15 (from the past 48 hour(s))  Comprehensive metabolic panel     Status: Abnormal   Collection Time: 08/03/15  6:44 PM  Result Value Ref Range   Sodium 140 135 - 145 mmol/L   Potassium 4.1 3.5 - 5.1 mmol/L   Chloride 104 101 - 111 mmol/L   CO2 27 22 - 32 mmol/L   Glucose, Bld 123 (H) 65 - 99 mg/dL   BUN 15 6 - 20 mg/dL   Creatinine, Ser 0.82 0.61 - 1.24 mg/dL   Calcium 9.2 8.9 - 10.3 mg/dL   Total Protein 8.1 6.5 - 8.1 g/dL   Albumin 4.4 3.5 - 5.0 g/dL   AST 107 (H) 15 - 41 U/L   ALT 139 (H) 17 - 63 U/L   Alkaline Phosphatase 59 38 - 126 U/L   Total Bilirubin 0.4 0.3 - 1.2 mg/dL   GFR calc non Af Amer >60 >60 mL/min   GFR calc Af Amer >60 >60 mL/min    Comment: (NOTE) The eGFR has been calculated using the CKD EPI equation. This calculation has  not been validated in all clinical situations. eGFR's persistently <60 mL/min signify possible Chronic Kidney Disease.    Anion gap 9 5 - 15  Ethanol (ETOH)     Status: None   Collection Time: 08/03/15  6:44 PM  Result Value Ref Range   Alcohol, Ethyl (B) <5 <5 mg/dL    Comment:        LOWEST DETECTABLE LIMIT FOR SERUM ALCOHOL IS 5 mg/dL FOR MEDICAL PURPOSES ONLY   Salicylate level     Status: None   Collection Time: 08/03/15  6:44 PM  Result Value Ref Range   Salicylate Lvl <1.6 2.8 - 30.0 mg/dL  Acetaminophen level     Status: Abnormal   Collection Time: 08/03/15  6:44 PM  Result Value Ref Range   Acetaminophen (Tylenol), Serum <10 (L) 10 - 30 ug/mL    Comment:        THERAPEUTIC CONCENTRATIONS VARY SIGNIFICANTLY. A RANGE OF 10-30 ug/mL MAY BE AN EFFECTIVE CONCENTRATION FOR MANY PATIENTS. HOWEVER, SOME ARE BEST TREATED AT CONCENTRATIONS OUTSIDE THIS RANGE. ACETAMINOPHEN CONCENTRATIONS >150 ug/mL AT 4 HOURS AFTER INGESTION AND >50 ug/mL AT 12 HOURS AFTER INGESTION ARE OFTEN ASSOCIATED  WITH TOXIC REACTIONS.   CBC     Status: None   Collection Time: 08/03/15  6:44 PM  Result Value Ref Range   WBC 9.8 3.8 - 10.6 K/uL   RBC 5.02 4.40 - 5.90 MIL/uL   Hemoglobin 16.1 13.0 - 18.0 g/dL   HCT 47.1 40.0 - 52.0 %   MCV 93.7 80.0 - 100.0 fL   MCH 32.0 26.0 - 34.0 pg   MCHC 34.1 32.0 - 36.0 g/dL   RDW 12.9 11.5 - 14.5 %   Platelets 287 150 - 440 K/uL  Urine Drug Screen, Qualitative ([facility] only)     Status: Abnormal   Collection Time: 08/03/15  6:44 PM  Result Value Ref Range   Tricyclic, Ur Screen POSITIVE (A) NONE DETECTED   Amphetamines, Ur Screen NONE DETECTED NONE DETECTED   MDMA (Ecstasy)Ur Screen NONE DETECTED NONE DETECTED   Cocaine Metabolite,Ur [place] NONE DETECTED NONE DETECTED   Opiate, Ur Screen NONE DETECTED NONE DETECTED   Phencyclidine (PCP) Ur S NONE DETECTED NONE DETECTED   Cannabinoid 50 Ng, Ur [place] NONE DETECTED NONE DETECTED   Barbiturates, Ur Screen NONE DETECTED NONE DETECTED   Benzodiazepine, Ur Scrn POSITIVE (A) NONE DETECTED   Methadone Scn, Ur NONE DETECTED NONE DETECTED    Comment: (NOTE) 109  Tricyclics, urine               Cutoff 1000 ng/mL 200  Amphetamines, urine             Cutoff 1000 ng/mL 300  MDMA (Ecstasy), urine           Cutoff 500 ng/mL 400  Cocaine Metabolite, urine       Cutoff 300 ng/mL 500  Opiate, urine                   Cutoff 300 ng/mL 600  Phencyclidine (PCP), urine      Cutoff 25 ng/mL 700  Cannabinoid, urine              Cutoff 50 ng/mL 800  Barbiturates, urine             Cutoff 200 ng/mL 900  Benzodiazepine, urine           Cutoff 200 ng/mL 1000 Methadone, urine  Cutoff 300 ng/mL 1100 1200 The urine drug screen provides only a preliminary, unconfirmed 1300 analytical test result and should not be used for non-medical 1400 purposes. Clinical consideration and professional judgment should 1500 be applied to any positive drug screen result due to possible 1600 interfering substances. A more specific alternate  chemical method 1700 must be used in order to obtain a confirmed analytical result.  1800 Gas chromato graphy / mass spectrometry (GC/MS) is the preferred 1900 confirmatory method.     Current Facility-Administered Medications  Medication Dose Route Frequency Provider Last Rate Last Dose  . asenapine (SAPHRIS) sublingual tablet 10 mg  10 mg Sublingual QHS Carrie Mew, MD   10 mg at 08/04/15 2155  . clonazePAM (KLONOPIN) tablet 0.5 mg  0.5 mg Oral QHS Rainey Pines, MD      . clonazePAM (KLONOPIN) tablet 1 mg  1 mg Oral STAT Gonzella Lex, MD      . clonazePAM (KLONOPIN) tablet 1 mg  1 mg Oral TID Gonzella Lex, MD      . hydrOXYzine (ATARAX/VISTARIL) tablet 25 mg  25 mg Oral Q12H PRN Carrie Mew, MD   25 mg at 08/05/15 0509  . ibuprofen (ADVIL,MOTRIN) tablet 800 mg  800 mg Oral TID PRN Lisa Roca, MD      . methocarbamol (ROBAXIN) tablet 750 mg  750 mg Oral Q8H PRN Carrie Mew, MD   750 mg at 08/05/15 0800  . nicotine (NICODERM CQ - dosed in mg/24 hours) patch 21 mg  21 mg Transdermal Once Ahmed Prima, MD   21 mg at 08/04/15 2202  . OXcarbazepine (TRILEPTAL) tablet 150 mg  150 mg Oral TID Carrie Mew, MD   150 mg at 08/05/15 1041  . pantoprazole (PROTONIX) EC tablet 40 mg  40 mg Oral Daily Carrie Mew, MD   40 mg at 08/05/15 1039  . prazosin (MINIPRESS) capsule 2 mg  2 mg Oral Q0600 Carrie Mew, MD   2 mg at 08/05/15 [phone]  . [START ON 08/06/2015] venlafaxine XR (EFFEXOR-XR) 24 hr capsule 150 mg  150 mg Oral Q breakfast Rainey Pines, MD       Current Outpatient Prescriptions  Medication Sig Dispense Refill  . asenapine (SAPHRIS) 5 MG SUBL 24 hr tablet Place 2 tablets (10 mg total) under the tongue at bedtime. 60 tablet 0  . hydrOXYzine (ATARAX/VISTARIL) 25 MG tablet Take 1 tablet (25 mg total) by mouth every 12 (twelve) hours as needed for anxiety. 30 tablet 0  . methocarbamol (ROBAXIN) 750 MG tablet Take 1 tablet (750 mg total) by mouth every 8 (eight)  hours as needed for muscle spasms. 30 tablet 0  . OXcarbazepine (TRILEPTAL) 150 MG tablet Take 1 tablet (150 mg total) by mouth 3 (three) times daily. 90 tablet 0  . pantoprazole (PROTONIX) 40 MG tablet Take 1 tablet (40 mg total) by mouth daily. 30 tablet 0  . prazosin (MINIPRESS) 2 MG capsule Take 1 capsule (2 mg total) by mouth daily at 6 (six) AM. 30 capsule 0  . venlafaxine XR (EFFEXOR-XR) 75 MG 24 hr capsule Take 3 capsules (225 mg total) by mouth daily with breakfast. 90 capsule 0  . lidocaine (LIDODERM) 5 % Place 1 patch onto the skin daily. Remove & Discard patch within 12 hours or as directed by MD 30 patch 0  . meloxicam (MOBIC) 15 MG tablet Take 1 tablet (15 mg total) by mouth daily. 30 tablet 0    Musculoskeletal: Strength & Muscle Tone:  within normal limits Gait & Station: normal Patient leans: N/A  Psychiatric Specialty Exam: Review of Systems  Constitutional: Negative.   HENT: Negative.   Eyes: Negative.   Respiratory: Negative.   Cardiovascular: Negative.   Gastrointestinal: Negative.   Musculoskeletal: Negative.   Skin: Negative.   Neurological: Negative.   Psychiatric/Behavioral: Positive for depression and suicidal ideas. Negative for hallucinations, memory loss and substance abuse. The patient is nervous/anxious and has insomnia.     Blood pressure 128/72, pulse 79, temperature 98 F (36.7 C), temperature source Oral, resp. rate 20, height 6' (1.829 m), weight 96.163 kg (212 lb), SpO2 96 %.Body mass index is 28.75 kg/(m^2).  General Appearance: Casual  Eye Contact::  Good  Speech:  Clear and Coherent  Volume:  Normal  Mood:  Euthymic  Affect:  Depressed  Thought Process:  Goal Directed  Orientation:  Full (Time, Place, and Person)  Thought Content:  Negative  Suicidal Thoughts:  Yes.  with intent/plan  Homicidal Thoughts:  No  Memory:  Immediate;   Good Recent;   Fair Remote;   Good  Judgement:  Fair  Insight:  Good  Psychomotor Activity:  Normal   Concentration:  Fair  Recall:  [employer] of Knowledge:Fair  Language: Fair  Akathisia:  No  Handed:  Right  AIMS (if indicated):     Assets:  Communication Skills Desire for Improvement Financial Resources/Insurance Housing Leisure Time Physical Health Resilience Social Support  ADL's:  Intact  Cognition: WNL  Sleep:      Treatment Plan Summary: Daily contact with patient to assess and evaluate symptoms and progress in treatment, Medication management and Plan Patient is actively reporting suicidal thoughts. Mood is reported as being unstable. He does not appear to be obviously psychotic and has been cooperative with treatment. History that he describes sounds most like bipolar disorder. Patient will be admitted to the psychiatry ward. 15 minute checks in place. Continue his recently prescribed medication doses. Primary treatment team can work on further dose adjustments. Labs will be checked including lipid panel and hemoglobin A1c. 15 minute checks in place. Patient agrees to the plan.  Disposition: Recommend psychiatric Inpatient admission when medically cleared. Discussed crisis plan, support from social network, calling 911, coming to the Emergency Department, and calling Suicide Hotline.  Sean Wyatt 08/05/2015 5:28 PM

## 2015-08-05 NOTE — ED Provider Notes (Signed)
-----------------------------------------   6:08 AM on 08/05/2015 -----------------------------------------   Blood pressure 107/68, pulse 80, temperature 98.4 F (36.9 C), temperature source Oral, resp. rate 18, height 6' (1.829 m), weight 212 lb (96.163 kg), SpO2 96 %.  The patient had no acute events since last update.  Calm and cooperative at this time.  Disposition is pending per Psychiatry/Behavioral Medicine team recommendations.     Irean HongJade J Sung, MD 08/05/15 [phone]

## 2015-08-06 ENCOUNTER — Inpatient Hospital Stay
Admit: 2015-08-06 | Discharge: 2015-08-14 | DRG: 885 | Disposition: A | Discharge status: Home / Self Care | Payer: Medicare Other | Source: Ambulatory Visit | Attending: Psychiatry | Admitting: Psychiatry

## 2015-08-06 DIAGNOSIS — F3130 Bipolar disorder, current episode depressed, mild or moderate severity, unspecified: Secondary | ICD-10-CM | POA: Diagnosis not present

## 2015-08-06 DIAGNOSIS — K219 Gastro-esophageal reflux disease without esophagitis: Secondary | ICD-10-CM | POA: Diagnosis present

## 2015-08-06 DIAGNOSIS — Z79899 Other long term (current) drug therapy: Secondary | ICD-10-CM | POA: Diagnosis not present

## 2015-08-06 DIAGNOSIS — G4700 Insomnia, unspecified: Secondary | ICD-10-CM | POA: Diagnosis present

## 2015-08-06 DIAGNOSIS — F319 Bipolar disorder, unspecified: Secondary | ICD-10-CM | POA: Diagnosis present

## 2015-08-06 DIAGNOSIS — Z888 Allergy status to other drugs, medicaments and biological substances status: Secondary | ICD-10-CM

## 2015-08-06 DIAGNOSIS — Z9889 Other specified postprocedural states: Secondary | ICD-10-CM

## 2015-08-06 DIAGNOSIS — Z818 Family history of other mental and behavioral disorders: Secondary | ICD-10-CM | POA: Diagnosis not present

## 2015-08-06 DIAGNOSIS — G894 Chronic pain syndrome: Secondary | ICD-10-CM | POA: Diagnosis present

## 2015-08-06 DIAGNOSIS — Z87442 Personal history of urinary calculi: Secondary | ICD-10-CM | POA: Diagnosis not present

## 2015-08-06 DIAGNOSIS — F4310 Post-traumatic stress disorder, unspecified: Secondary | ICD-10-CM | POA: Diagnosis present

## 2015-08-06 DIAGNOSIS — Z89519 Acquired absence of unspecified leg below knee: Secondary | ICD-10-CM

## 2015-08-06 DIAGNOSIS — E8881 Metabolic syndrome: Secondary | ICD-10-CM | POA: Diagnosis present

## 2015-08-06 DIAGNOSIS — R45851 Suicidal ideations: Secondary | ICD-10-CM | POA: Diagnosis present

## 2015-08-06 DIAGNOSIS — F17200 Nicotine dependence, unspecified, uncomplicated: Secondary | ICD-10-CM | POA: Diagnosis present

## 2015-08-06 DIAGNOSIS — Z87891 Personal history of nicotine dependence: Secondary | ICD-10-CM | POA: Diagnosis not present

## 2015-08-06 DIAGNOSIS — F411 Generalized anxiety disorder: Secondary | ICD-10-CM | POA: Diagnosis present

## 2015-08-06 LAB — LIPID PANEL
Cholesterol: 240 mg/dL — ABNORMAL HIGH (ref 0–200)
HDL: 29 mg/dL — ABNORMAL LOW (ref >40)
LDL CALC: 141 mg/dL — AB (ref 0–99)
TRIGLYCERIDES: 352 mg/dL — AB (ref <150)
Total CHOL/HDL Ratio: 8.3 RATIO
VLDL: 70 mg/dL — AB (ref 0–40)

## 2015-08-06 LAB — HEMOGLOBIN A1C: Hgb A1c MFr Bld: 5.9 % (ref 4.0–6.0)

## 2015-08-06 LAB — TSH: TSH: 1.3 u[IU]/mL (ref 0.350–4.500)

## 2015-08-06 MED ORDER — CLONAZEPAM 0.5 MG PO TABS: 0.5000 mg | ORAL_TABLET | Freq: Every day | ORAL | Status: DC

## 2015-08-06 MED ORDER — VENLAFAXINE HCL ER 75 MG PO CP24
150.0000 mg | ORAL_CAPSULE | Freq: Every day | ORAL | Status: DC
  Administered 2015-08-06 – 2015-08-12 (×7): 150 mg via ORAL
  Filled 2015-08-06 (×7): qty 2

## 2015-08-06 MED ORDER — FENTANYL 25 MCG/HR TD PT72
25.0000 ug | MEDICATED_PATCH | TRANSDERMAL | Status: DC
  Administered 2015-08-06 – 2015-08-12 (×3): 25 ug via TRANSDERMAL
  Filled 2015-08-06 (×2): qty 1

## 2015-08-06 MED ORDER — PANTOPRAZOLE SODIUM 40 MG PO TBEC
40.0000 mg | DELAYED_RELEASE_TABLET | Freq: Every day | ORAL | Status: DC
  Administered 2015-08-06 – 2015-08-14 (×9): 40 mg via ORAL
  Filled 2015-08-06 (×16): qty 1

## 2015-08-06 MED ORDER — HYDROXYZINE HCL 25 MG PO TABS
25.0000 mg | ORAL_TABLET | Freq: Two times a day (BID) | ORAL | Status: DC | PRN
  Administered 2015-08-07 – 2015-08-09 (×3): 25 mg via ORAL
  Filled 2015-08-06 (×4): qty 1

## 2015-08-06 MED ORDER — ACETAMINOPHEN 325 MG PO TABS
650.0000 mg | ORAL_TABLET | Freq: Four times a day (QID) | ORAL | Status: DC | PRN
  Administered 2015-08-06: 650 mg via ORAL
  Filled 2015-08-06: qty 2

## 2015-08-06 MED ORDER — CLONAZEPAM 1 MG PO TABS
1.0000 mg | ORAL_TABLET | Freq: Three times a day (TID) | ORAL | Status: DC
  Administered 2015-08-06 (×2): 1 mg via ORAL
  Filled 2015-08-06 (×3): qty 1

## 2015-08-06 MED ORDER — ASENAPINE MALEATE 5 MG SL SUBL
10.0000 mg | SUBLINGUAL_TABLET | Freq: Every day | SUBLINGUAL | Status: DC
  Administered 2015-08-06 – 2015-08-11 (×6): 10 mg via SUBLINGUAL
  Filled 2015-08-06 (×7): qty 2

## 2015-08-06 MED ORDER — IBUPROFEN 800 MG PO TABS
800.0000 mg | ORAL_TABLET | Freq: Three times a day (TID) | ORAL | Status: DC | PRN
  Administered 2015-08-06 – 2015-08-14 (×11): 800 mg via ORAL
  Filled 2015-08-06 (×11): qty 1

## 2015-08-06 MED ORDER — METHOCARBAMOL 750 MG PO TABS
750.0000 mg | ORAL_TABLET | Freq: Three times a day (TID) | ORAL | Status: DC | PRN
  Administered 2015-08-06 – 2015-08-12 (×3): 750 mg via ORAL
  Filled 2015-08-06 (×5): qty 1

## 2015-08-06 MED ORDER — FENTANYL 25 MCG/HR TD PT72
25.0000 ug | MEDICATED_PATCH | TRANSDERMAL | Status: DC
  Filled 2015-08-06 (×2): qty 1

## 2015-08-06 MED ORDER — ALUM & MAG HYDROXIDE-SIMETH 200-200-20 MG/5ML PO SUSP: 30.0000 mL | ORAL | Status: DC | PRN

## 2015-08-06 MED ORDER — PRAZOSIN HCL 2 MG PO CAPS
2.0000 mg | ORAL_CAPSULE | Freq: Every day | ORAL | Status: DC
  Administered 2015-08-06 – 2015-08-14 (×9): 2 mg via ORAL
  Filled 2015-08-06 (×9): qty 1

## 2015-08-06 MED ORDER — TEMAZEPAM 15 MG PO CAPS
30.0000 mg | ORAL_CAPSULE | Freq: Every evening | ORAL | Status: DC | PRN
  Administered 2015-08-12: 30 mg via ORAL
  Filled 2015-08-06: qty 2

## 2015-08-06 MED ORDER — ZIPRASIDONE HCL 40 MG PO CAPS
40.0000 mg | ORAL_CAPSULE | Freq: Two times a day (BID) | ORAL | Status: DC
  Administered 2015-08-06 – 2015-08-07 (×2): 40 mg via ORAL
  Filled 2015-08-06 (×2): qty 1

## 2015-08-06 MED ORDER — CLONAZEPAM 1 MG PO TABS
1.0000 mg | ORAL_TABLET | ORAL | Status: AC
  Administered 2015-08-06: 1 mg via ORAL
  Filled 2015-08-06: qty 1

## 2015-08-06 MED ORDER — OXCARBAZEPINE 150 MG PO TABS
150.0000 mg | ORAL_TABLET | Freq: Three times a day (TID) | ORAL | Status: DC
  Administered 2015-08-06 (×2): 150 mg via ORAL

## 2015-08-06 MED ORDER — OXCARBAZEPINE 300 MG PO TABS
300.0000 mg | ORAL_TABLET | Freq: Two times a day (BID) | ORAL | Status: DC
  Administered 2015-08-06 – 2015-08-14 (×16): 300 mg via ORAL
  Filled 2015-08-06 (×17): qty 1

## 2015-08-06 MED ORDER — MAGNESIUM HYDROXIDE 400 MG/5ML PO SUSP
30.0000 mL | Freq: Every day | ORAL | Status: DC | PRN
Start: 2015-08-06 — End: 2015-08-14
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
## 2015-08-06 NOTE — Progress Notes (Signed)
Patient with depressed affect, cooperative behavior with meals, meds and plan of care. No SI/HI at this time. Good adls, good appetite. Patient recent overnight admit and remains tired today. Encouraged to attend therapy groups to learn and initiate coping skills for management of stressors and diagnosis. Safety maintained.

## 2015-08-06 NOTE — ED Notes (Signed)
Called again to give report on pt going to Rm [phone] after not hearing anything after over an hour. UC/secretary stated that they were [facility] and would call the main ED for report when they were ready to accept the pt downstairs.

## 2015-08-06 NOTE — Plan of Care (Signed)
Problem: Spiritual Needs Goal: Ability to function at adequate level Outcome: Not Progressing Patient remains depressed, withdrawn and tired rt recent overnight admit.

## 2015-08-06 NOTE — Progress Notes (Signed)
Recreation Therapy Notes  Date: 10.25.16 Time: 3:00 pm Location: Craft Room  Group Topic: Goal Setting  Goal Area(s) Addresses:  Patient will write at least one goal. Patient will write at least one obstacle.  Behavioral Response: Did not attend  Intervention: Recovery Goal Chart  Activity: Patients were instructed to make a goal chart including goals towards their recovery, obstacles, the date they started working on their goal, and the date they achieved their goal.  Education: LRT educated patients on healthy ways they can celebrate reaching their goals.  Education Outcome: Date: 10.25.16 Time: 3:00 pm Location: Craft Room  Group Topic: Goal Setting  Goal Area(s) Addresses:  Patient will write at least one goal. Patient will write at least one obstacle.  Behavioral Response: Did not attend  Intervention: Recovery Goal Chart  Activity: Patients were instructed to make a goal chart including goals towards their recovery, obstacles, the date they started working on their goal, and the date they achieved their goal.  Education: LRT educated patients on healthy ways they can celebrate reaching their goals.  Education Outcome: Patient did not attend group.   Clinical Observations/Feedback: Patient did not attend group.  Clinical Observations/Feedback: Patient did not attend group.  Jacquelynn CreeGreene,Natasha Paulson M, LRT/CTRS 08/06/2015 4:28 PM

## 2015-08-06 NOTE — Tx Team (Signed)
Initial Interdisciplinary Treatment Plan   PATIENT STRESSORS: Medication change or noncompliance   PATIENT STRENGTHS: Average or above average intelligence Capable of independent living   PROBLEM LIST: Problem List/Patient Goals Date to be addressed Date deferred Reason deferred Estimated date of resolution  depression 08/06/2015     Suicidal ideation 08/06/2015                                                DISCHARGE CRITERIA:  Verbal commitment to aftercare and medication compliance  PRELIMINARY DISCHARGE PLAN: Placement in alternative living arrangements  PATIENT/FAMIILY INVOLVEMENT: This treatment plan has been presented to and reviewed with the patient, Sean Wyatt, and/or family member.  The patient and family have been given the opportunity to ask questions and make suggestions.  Foster Simpsonrina Rahul 08/06/2015, 4:01 AM

## 2015-08-07 DIAGNOSIS — F17200 Nicotine dependence, unspecified, uncomplicated: Secondary | ICD-10-CM | POA: Diagnosis present

## 2015-08-07 MED ORDER — NICOTINE 21 MG/24HR TD PT24
21.0000 mg | MEDICATED_PATCH | Freq: Every day | TRANSDERMAL | Status: DC
  Administered 2015-08-07 – 2015-08-14 (×8): 21 mg via TRANSDERMAL
  Filled 2015-08-07 (×7): qty 1

## 2015-08-07 MED ORDER — CLONAZEPAM 0.5 MG PO TABS
0.5000 mg | ORAL_TABLET | Freq: Three times a day (TID) | ORAL | Status: DC
  Administered 2015-08-07 – 2015-08-14 (×22): 0.5 mg via ORAL
  Filled 2015-08-07 (×23): qty 1

## 2015-08-07 MED ORDER — ZIPRASIDONE HCL 40 MG PO CAPS
80.0000 mg | ORAL_CAPSULE | Freq: Two times a day (BID) | ORAL | Status: DC
  Administered 2015-08-07 – 2015-08-11 (×8): 80 mg via ORAL
  Filled 2015-08-07 (×8): qty 2

## 2015-08-07 NOTE — [facility] Group Notes (Signed)
[facility] Group Notes:  (Nursing/MHT/Case Management/Adjunct)  Date:  08/07/2015  Time:  2:13 PM  Type of Therapy:  Psychoeducational Skills  Participation Level:  Active  Participation Quality:  Appropriate, Attentive and Sharing  Affect:  Appropriate  Cognitive:  Alert and Appropriate  Insight:  Appropriate and Good  Engagement in Group:  Engaged  Modes of Intervention:  Discussion, Education and Support  Summary of Progress/Problems:  Lynelle SmokeCara Travis Asmi Fugere 08/07/2015, 2:13 PM

## 2015-08-07 NOTE — [facility] Group Notes (Signed)
[facility] LCSW Group Therapy  08/07/2015 2:49 PM  Type of Therapy:  Group Therapy  Participation Level:  Active  Participation Quality:  Appropriate and Attentive  Affect:  Appropriate  Cognitive:  Alert, Appropriate and Oriented  Insight:  Engaged  Engagement in Therapy:  Engaged  Modes of Intervention:  Discussion, Socialization and Support  Summary of Progress/Problems: Patient attended and participated in group discussion appropriately. Patient introduced himself and shared that he gets joy from "growing food, growing a garden, Psychologist, occupationalwriting poetry, songs, and short stories..., I also like painting houses". 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   Lulu RidingIngle, Wana Mount T, MSW, LCSWA 08/07/2015, 2:49 PM

## 2015-08-07 NOTE — Progress Notes (Signed)
Patient ID: Sean Wyatt, male   DOB: [birth year], 49 y.o.   MRN: [id number] PER STATE REGULATIONS 482.30  THIS CHART WAS REVIEWED FOR MEDICAL NECESSITY WITH RESPECT TO THE PATIENT'S ADMISSION/ DURATION OF STAY.  NEXT REVIEW DATE: 08/10/2015  Willa RoughJENNIFER JONES [facility], RN, BSN CASE MANAGER

## 2015-08-07 NOTE — Plan of Care (Signed)
Problem: Diagnosis: Increased Risk For Suicide Attempt Goal: STG-Patient Will Comply With Medication Regime Outcome: Progressing Medication Compliant, PRN 25 mg Hydroxyzine given for anxiety.

## 2015-08-07 NOTE — Progress Notes (Signed)
Patient A&Ox3, denied SI/HI, denied AV/H, Slept for 6 hours

## 2015-08-07 NOTE — Progress Notes (Signed)
Observed in room resting, responded to prompt (Knock and Greetings), mood and affect appropriate, happy to have nicotine patch and Klonopin on board. Denied SI/HI/SIB, denied AV/H. Happy about the course of care.
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
## 2015-08-07 NOTE — Plan of Care (Signed)
Problem: Diagnosis: Increased Risk For Suicide Attempt Goal: STG-Patient Will Comply With Medication Regime Outcome: Progressing Medication compliant     

## 2015-08-07 NOTE — [facility] Group Notes (Signed)
[facility]   08/07/2015 12:15 PM  Participation Quality:  Active  Mood/Affect:  Appropriate  Thoughts of Suicide:  NA Will you contract for safety?   NA  Current AVH:  NA  Plan for Discharge/Comments:  Patient attended group and participated appropriately. He shared that his goal is to work on his anxiety and depression by attending groups, taking medication appropriately, and socialize with others throughout the day. Patient identified an understanding of goals, personal development, and provided support to group members throughout the session.  Transportation Means: Patient was informed about different transportation resources and encouraged to speak to CSW if a referral is needed.  Supports: Patient was encouraged to speak with CSW for community resources, if needed.  Jenel LucksJasmine Delio, Clinical Social Work Intern 08/07/15  Beryl MeagerJason Ginny Loomer, MSW,  Theresia MajorsLCSWA  08/07/15

## 2015-08-07 NOTE — Plan of Care (Signed)
Problem: Spiritual Needs Goal: Ability to function at adequate level Outcome: Progressing Patient continues to express feelings, more positive today and happy with treatments and medications, room closer to the nurses' station for routine and random observations; denied SI/HI, denied AV/H, approaching enduring pattern/functioning baseline. Nursing staffs will continue to provide clinical and moral support.

## 2015-08-07 NOTE — Progress Notes (Signed)
Recreation Therapy Notes  Date: 10.26.16 Time: 3:00 pm Location: Craft Room  Group Topic: Self-esteem  Goal Area(s) Addresses:  Patient will write at least one positive trait. Patient will verbalize benefit of having a healthy self-esteem.  Behavioral Response: Did not attend  Intervention: I Am  Activity: Patients were given a worksheet with the letter I on it and instructed to fill the letter with as many positive traits about themselves as they could.  Education:LRT educated patients on ways they can increase their self-esteem.   Education Outcome: Patient did not attend group.   Clinical Observations/Feedback: Patient did not attend group.  Jacquelynn CreeGreene,Marlisa Caridi M, LRT/CTRS 08/07/2015 4:36 PM

## 2015-08-07 NOTE — Progress Notes (Signed)
D:  Rated depression as a 8.  Denies any thoughts of SI or AVH.  Verbalizes that he is anxious and verbalizes that he needs his clonazepam and a nicotine patch.  Goal is to get his anxiety under control and attend groups. A:  Informed Dr. Rennis Pettyf pt requests.  Encouragement and support offered.  Encouraged group attendance.   R:  Compliant with treatment plan.  Medication and group compliant.  Interacting appropriately on the unit with peers and staff.

## 2015-08-07 NOTE — [facility] Group Notes (Signed)
[facility] Group Notes:  (Nursing/MHT/Case Management/Adjunct)  Date:  08/07/2015  Time:  10:29 PM  Type of Therapy:  Group Therapy  Participation Level:  Active  Participation Quality:  Appropriate  Affect:  Appropriate  Cognitive:  Appropriate  Insight:  Appropriate  Engagement in Group:  Engaged  Modes of Intervention:  Discussion  Summary of Progress/Problems:  Sean Wyatt 08/07/2015, 10:29 PM

## 2015-08-08 MED ORDER — LIDOCAINE VISCOUS 2 % MT SOLN
15.0000 mL | Freq: Two times a day (BID) | OROMUCOSAL | Status: DC
  Administered 2015-08-09 – 2015-08-14 (×4): 15 mL via OROMUCOSAL
  Filled 2015-08-08 (×7): qty 15

## 2015-08-08 NOTE — Progress Notes (Signed)
Recreation Therapy Notes  INPATIENT RECREATION THERAPY ASSESSMENT  Patient Details Name: Sean Wyatt MRN: [id number] DOB: Oct 15, 1965 Today's Date: 08/08/2015  Patient Stressors: Family, Death (Not getting along with daughters - they do not understand his mental illness, son is "almost bowing down to [place]"; brother who he considered his best friend died in 2014 - patient has been dreaming about him recently)  Coping Skills:   Isolate, Avoidance, Exercise, Art/Dance, Talking, Music, Sports, Other (Comment) Scientist, forensic(Self-talk)  Personal Challenges: Concentration, Decision-Making, Problem-Solving, Relationships, Self-Esteem/Confidence, Stress Management, Time Management, Trusting Others  Leisure Interests (2+):  Individual - Other (Comment) (Travel, spend time with grandkids)  Awareness of Community Resources:  Yes  Community Resources:  Park, Other (Comment) [place]  Current Use: Yes  If no, Barriers?:    Patient Strengths:  Estate agentAwesome grandpa, talented when it comes to writing music  Patient Identified Areas of Improvement:  Not being as paranoid, no more ups and downs  Current Recreation Participation:  Traveling  Patient Goal for Hospitalization:  To get medication straight and anxiety and depression under control  [place]   Current SI (including self-harm):  No  Current HI:  No  Consent to Intern Participation: N/A   Jacquelynn CreeGreene,Raynisha Avilla M, LRT/CTRS 08/08/2015, 4:45 PM

## 2015-08-08 NOTE — Progress Notes (Signed)
Pleasant and cooperative with care. No negative behaviors. C/o backache, meds given with good relief. Denies SI, HI, AVH. Will continue to assess and monitor for safety.

## 2015-08-08 NOTE — Progress Notes (Signed)
Patient up, requested for HydrOXYzine 25 mg, was allowed to express feelings, he plan to drive back home to [place] alone "I set out on vacation to experience nature and God, [place] came to [place] to visit my mother's side of the family .Marland Kitchen. My father and 4 adult children are in [place], I can't wait to see my grandchildren, I will not kill myself. I will stay on my medications and strength in God."

## 2015-08-08 NOTE — Plan of Care (Signed)
Problem: Diagnosis: Increased Risk For Suicide Attempt Goal: STG-Patient Will Comply With Medication Regime Outcome: Progressing Med compliant     

## 2015-08-08 NOTE — Progress Notes (Signed)
Recreation Therapy Notes  Date: 10.27.16 Time: 3:15 pm Location: Craft Room  Group Topic: Leisure Education  Goal Area(s) Addresses:  Patient will identify activities for each letter of the alphabet. Patient will verbalize ability to integrate positive leisure into life post d/c. Patient will verbalize ability to use leisure as a Associate Professorcoping skill.  Behavioral Response: Did not attend   Intervention: Leisure Alphabet  Activity: Patients were given a Leisure Alphabet worksheet and instructed to think of healthy leisure activities for each letter of the alphabet.  Education: LRT educated patients on what they need to participate in leisure.  Education Outcome: Patient did not attend group.  Clinical Observations/Feedback: Patient did not attend group.  Jacquelynn CreeGreene,Rye Decoste M, LRT/CTRS 08/08/2015 4:22 PM

## 2015-08-08 NOTE — [facility] Group Notes (Signed)
[facility] Group Notes:  (Nursing/MHT/Case Management/Adjunct)  Date:  08/08/2015  Time:  2:21 PM  Type of Therapy:  Psychoeducational Skills  Participation Level:  Active  Participation Quality:  Appropriate, Attentive and Sharing  Affect:  Appropriate  Cognitive:  Alert and Appropriate  Insight:  Appropriate  Engagement in Group:  Engaged  Modes of Intervention:  Discussion, Education and Support  Summary of Progress/Problems:  Lynelle SmokeCara Travis Etosha Wetherell 08/08/2015, 2:21 PM
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
## 2015-08-09 MED ORDER — HYDROXYZINE HCL 50 MG PO TABS
50.0000 mg | ORAL_TABLET | Freq: Every day | ORAL | Status: DC
  Administered 2015-08-09: 50 mg via ORAL
  Filled 2015-08-09: qty 1

## 2015-08-09 MED ORDER — ATORVASTATIN CALCIUM 20 MG PO TABS
20.0000 mg | ORAL_TABLET | Freq: Every day | ORAL | Status: DC
  Administered 2015-08-10 – 2015-08-12 (×2): 20 mg via ORAL
  Filled 2015-08-09 (×3): qty 1

## 2015-08-09 NOTE — Progress Notes (Signed)
Patient pleasant and cooperative.  Verbalizes that the geodon is making him sleepy and therefore he has to go back to bed after he eats breakfast. Denies SI.  Medication and group compliant.  Visible in milieu. Interacting with peers and staff appropriately.
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
## 2015-08-09 NOTE — Plan of Care (Signed)
Problem: Diagnosis: Increased Risk For Suicide Attempt Goal: STG-Patient Will Comply With Medication Regime Outcome: Progressing Medication compliant

## 2015-08-09 NOTE — [facility] Group Notes (Signed)
[facility] Group Notes:  (Nursing/MHT/Case Management/Adjunct)  Date:  08/09/2015  Time:  10:38 PM  Type of Therapy:  Evening Wrap-up Group  Participation Level:  Active  Participation Quality:  Appropriate  Affect:  Appropriate  Cognitive:  Alert  Insight:  Good  Engagement in Group:  Engaged  Modes of Intervention:  Activity  Summary of Progress/Problems:  Sean MorrowChelsea Wyatt Sean Wyatt 08/09/2015, 10:38 PM

## 2015-08-09 NOTE — [facility] Group Notes (Signed)
[facility] LCSW Group Therapy  08/09/2015 5:07 PM  Type of Therapy:  Group Therapy  Participation Level:  Active  Participation Quality:  Appropriate and Attentive  Affect:  Appropriate  Cognitive:  Alert, Appropriate and Oriented  Insight:  Engaged  Engagement in Therapy:  Engaged  Modes of Intervention:  Discussion, Socialization and Support  Summary of Progress/Problems:Patient attended and participated appropriately in group discussion. Patient introduced himself and shared that he gets joy from "writing music and listening to music". Patient was able to share how he has relapsed by getting off of his medications and causing him to relapse with behaviors but not substances or alcohol. Patient was able to offer support to other group members and suggestions of having a strong support network of people that are positive and that can be trusted.   Lulu RidingIngle, Sean Wyatt T, MSW, LCSWA 08/09/2015, 5:07 PM

## 2015-08-09 NOTE — Progress Notes (Signed)
Recreation Therapy Notes  Date: 10.28.16 Time: 3:00 pm Location: Craft Room  Group Topic: Communication, Problem Solving, Teamwork  Goal Area(s) Addresses:  Patient will effectively work with peers towards shared goal. Patient will identify skills used to make activity successful. Patient will identify benefit of using group skills effectively post d/c.  Behavioral Response: Attentive, Interactive  Intervention: [employer] Tower  Activity: Patients were given 15 pipe cleanser and instructed to build the tallest free standing tower. Patients were given 2 minutes to strategize. After approximately 5 minutes of building, patients were instructed to put their dominant hand behind their back. After approximately 3 minutes of building, patients were instructed to stop talking to each other.    Education: LRT educated patients on how communication, problem solving, and teamwork goes in to building a healthy support system.  Education Outcome: Acknowledges education/In group clarification offered  Clinical Observations/Feedback: Patient worked with team to build tower. Patient used communication, problem solving and team work. Patient contributed to group discussion by stating how his team worked together effectively, how they used effective communication, problem solving, and teamwork, how these skills go into building a healthy support system, what prevents him from using these skills at home, and what would change for him if he used these skills.  Jacquelynn CreeGreene,Kapri Nero M, LRT/CTRS 08/09/2015 4:33 PM

## 2015-08-09 NOTE — Progress Notes (Signed)
Thoughtful, mood and affect appropriate, pleasant, denied SI/HI/SIB, anticipating discharge and traveling back to [place].

## 2015-08-09 NOTE — Plan of Care (Signed)
Problem: Spiritual Needs Goal: Ability to function at adequate level Outcome: Progressing Medications administered as ordered by the physician, medications Therapeutic Effects, SEs and Adverse effects discussed, questions encouraged; no PRN given, 15 minute checks maintained for safety, clinical and moral support provided, patient encouraged to continue to express feelings and demonstrate safe care. Patient remain free from harm, will continue to monitor.

## 2015-08-09 NOTE — Plan of Care (Signed)
Problem: [facility] Participation in Recreation Therapeutic Interventions Goal: STG-Patient will demonstrate improved self esteem by identif STG: Self-Esteem - Within 4 treatment sessions, patient will verbalize at least 5 positive affirmation statements in each of 2 treatment sessions to increase self-esteem post d/c.  Outcome: Progressing Treatment Session 1; Completed 1 out of 2: At approximately 1:15 pm, LRT met with patient in consultation room. Patient verbalized 5 positive affirmation statements. Patient reported it felt "fine". LRT encouraged patient to continue saying positive affirmation statements. Intervention Used: I Am statements  Leonette Monarch, LRT/CTRS 10.28.16 2:38 pm Goal: STG-Other Recreation Therapy Goal (Specify) STG: Stress Management - Within 4 treatment sessions, patient will verbalize understanding of the stress management techniques in each of 2 treatment sessions to increase stress management skills post d/c.  Outcome: Progressing Treatment Session 1; Completed 1 out of 2: At approximately 1:15 pm, LRT met with patient in consultation room. LRT educated and provided patient with handouts on stress management techniques. Patient verbalized understanding. LRT encouraged patient to read over and practice the stress management techniques. Intervention Used: Mindfulness and Progressive Muscle Relaxation handouts  Leonette Monarch, LRT/CTRS 10.28.16 2:40 pm

## 2015-08-10 MED ORDER — HYDROXYZINE HCL 50 MG PO TABS
50.0000 mg | ORAL_TABLET | Freq: Three times a day (TID) | ORAL | Status: DC | PRN
  Administered 2015-08-14: 50 mg via ORAL
  Filled 2015-08-10: qty 1
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
## 2015-08-10 NOTE — Progress Notes (Signed)
Pt has been pleasant and cooperative. Pt's mood and affect has been depressed. Pt denies SI and A/V hallucinations.Pt has been active on the unit.
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
## 2015-08-10 NOTE — [facility] Group Notes (Signed)
[facility] LCSW Group Therapy  08/10/2015 2:32 PM  Type of Therapy:  Group Therapy  Participation Level:  Did Not Attend  Modes of Intervention:  Discussion, Education, Socialization and Support  Summary of Progress/Problems: Pt will identify unhealthy thoughts and how they impact their emotions and behavior. Pt will be encouraged to discuss these thoughts, emotions and behaviors with the group.   Nitzia Perren L Karenann Mcgrory MSW, LCSWA  08/10/2015, 2:32 PM  

## 2015-08-10 NOTE — Plan of Care (Signed)
Problem: Diagnosis: Increased Risk For Suicide Attempt Goal: STG-Patient Will Comply With Medication Regime Outcome: Progressing Pt med compliant.  [facility] Re[facility] Therapeutic Interventions Goal: STG-Patient will demonstrate improved self esteem by identif STG: Self-Esteem - Within 4 treatment sessions, patient will verbalize at least 5 positive affirmation statements in each of 2 treatment sessions to increase self-esteem post d/c.  Outcome: Progressing Pt assertive and confident in his medication. States he feels better and notes he is ready for bed. Pleasant and cooperative.

## 2015-08-10 NOTE — Plan of Care (Signed)
Problem: Spiritual Needs Goal: Ability to function at adequate level Outcome: Progressing Pt functioning at adequate level.   Problem: Diagnosis: Increased Risk For Suicide Attempt Goal: STG-Patient Will Comply With Medication Regime Outcome: Progressing Pt is medication compliant.

## 2015-08-11 MED ORDER — ZIPRASIDONE HCL 40 MG PO CAPS
60.0000 mg | ORAL_CAPSULE | Freq: Two times a day (BID) | ORAL | Status: DC
  Administered 2015-08-11 – 2015-08-12 (×2): 60 mg via ORAL
  Filled 2015-08-11 (×2): qty 1

## 2015-08-11 NOTE — Plan of Care (Signed)
Problem: Diagnosis: Increased Risk For Suicide Attempt Goal: STG-Patient Will Comply With Medication Regime Outcome: Progressing Compliant with medications

## 2015-08-11 NOTE — Progress Notes (Signed)
Patient is pleasant and cooperative.  Only complaint is feels that the amount of geodon he is taking is making him tired and sleepy during the day and he cannot sleep at night.  This was brought to Dr. Mayford KnifeWilliams attention.  Affect sad.  Denies SI and AVH.  Medication and group compliant.  Visible in milieu and interacting appropriately with peers and staff.
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
## 2015-08-11 NOTE — [facility] Group Notes (Signed)
[facility] Group Notes:  (Nursing/MHT/Case Management/Adjunct)  Date:  08/11/2015  Time:  9:21 AM  Type of Therapy:  Goal-Setting  Participation Level:  Active  Participation Quality:  Appropriate and Sharing  Affect:  Appropriate  Cognitive:  Alert and Appropriate  Insight:  Good  Engagement in Group:  Engaged  Modes of Intervention:  Discussion  Summary of Progress/Problems:  Isabelle CourseWhitney R Amyre Segundo 08/11/2015, 9:21 AM

## 2015-08-12 MED ORDER — ASENAPINE MALEATE 5 MG SL SUBL
5.0000 mg | SUBLINGUAL_TABLET | Freq: Every day | SUBLINGUAL | Status: DC
  Administered 2015-08-12 – 2015-08-14 (×2): 5 mg via SUBLINGUAL
  Filled 2015-08-12: qty 1

## 2015-08-12 MED ORDER — VENLAFAXINE HCL ER 75 MG PO CP24
225.0000 mg | ORAL_CAPSULE | Freq: Every day | ORAL | Status: DC
  Administered 2015-08-13 – 2015-08-14 (×2): 225 mg via ORAL
  Filled 2015-08-12 (×2): qty 3

## 2015-08-12 MED ORDER — ZIPRASIDONE HCL 40 MG PO CAPS
40.0000 mg | ORAL_CAPSULE | Freq: Every day | ORAL | Status: DC
  Administered 2015-08-13 – 2015-08-14 (×2): 40 mg via ORAL
  Filled 2015-08-12 (×3): qty 1

## 2015-08-12 MED ORDER — ZIPRASIDONE HCL 40 MG PO CAPS
80.0000 mg | ORAL_CAPSULE | Freq: Every day | ORAL | Status: DC
  Administered 2015-08-12 – 2015-08-13 (×2): 80 mg via ORAL
  Filled 2015-08-12 (×2): qty 2

## 2015-08-12 NOTE — Progress Notes (Signed)
Recreation Therapy Notes  Date: 10.31.16 Time: 3:00 pm Location: Craft Room  Group Topic: Wellness  Goal Area(s) Addresses:  Patient will identify at least one item per dimension of health. Patient will examine areas they are deficient in.  Behavioral Response: Attentive, interactive  Intervention: 6 Dimensions of Health  Activity: Patients were given a worksheet with the definitions of the 6 dimensions of health. Patients were given a second worksheet with the 6 dimensions of health on it and instructed to write out at least one item they are currently doing in each dimension.  Education: LRT educated patients on way they can improve each dimension.  Education Outcome: Acknowledges education/In group clarification offered  Clinical Observations/Feedback: Patient completed activity by writing at least 2 items in each dimension. Patient contributed to group discussion by stating what areas he was giving enough attention to, what areas he was not giving enough attention to, how he can improve certain wellness areas, and how this activity is related to his admission and d/c.  Jacquelynn CreeGreene,Shirrell Solinger M, LRT/CTRS 08/12/2015 4:19 PM
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
## 2015-08-12 NOTE — Progress Notes (Signed)
D: Patient denies SI/HI/AVH.  Patient affect and mood are depressed.  Patient did attend evening group. Patient visible on the milieu. No distress noted. A: Support and encouragement offered. Scheduled medications given to pt. Q 15 min checks continued for patient safety. R: Patient receptive. Patient remains safe on the unit.   

## 2015-08-12 NOTE — [facility] Group Notes (Signed)
[facility] LCSW Group Therapy  08/12/2015 3:48 PM  Type of Therapy:  Group Therapy  Participation Level:  Active  Participation Quality:  Appropriate, Attentive, Monopolizing and Redirectable  Affect:  Appropriate  Cognitive:  Alert, Appropriate and Oriented  Insight:  Engaged  Engagement in Therapy:  Engaged  Modes of Intervention:  Discussion, Socialization and Support  Summary of Progress/Problems: Patient attended and participated in group discussion appropriately however monopolized at times but was redirectable. Patient introduced himself and participated in introductions appropriately. Patient shared that his faith is part of coping for him but is also aware of the need for medications. Patient was able to provide support to other group members.    Lulu RidingIngle, Eural Holzschuh T, MSW, LCSWA 08/12/2015, 3:48 PM

## 2015-08-12 NOTE — Plan of Care (Signed)
Problem: Spiritual Needs Goal: Ability to function at adequate level Outcome: Progressing Patient praying with peers and using spiritual faith as a positive coping skill.

## 2015-08-12 NOTE — Plan of Care (Signed)
Problem: [facility] Participation in Recreation Therapeutic Interventions Goal: STG-Patient will demonstrate improved self esteem by identif STG: Self-Esteem - Within 4 treatment sessions, patient will verbalize at least 5 positive affirmation statements in each of 2 treatment sessions to increase self-esteem post d/c.  Outcome: Completed/Met Date Met:  08/12/15 Treatment Session 2; Completed 2 out of 2: At approximately 12:55 pm, LRT met with patient in consultation room. Patient verbalized 5 positive affirmation statements. Patient reported it felt "good". LRT encouraged patient to continue saying positive affirmation statements.  Leonette Monarch, LRT/CTRS 10.31.16 4:47 pm Goal: STG-Other Recreation Therapy Goal (Specify) STG: Stress Management - Within 4 treatment sessions, patient will verbalize understanding of the stress management techniques in each of 2 treatment sessions to increase stress management skills post d/c.  Outcome: Completed/Met Date Met:  08/12/15 Treatment Session 2; Completed 2 out of 2: At approximately 12:55 pm, LRT met with patient in consultation room. Patient verbalized understanding of the stress management techniques.  Leonette Monarch, LRT/CTRS 10.31.16 4:49 pm

## 2015-08-12 NOTE — Progress Notes (Signed)
D: Patient states he is still feeling depressed but is hopeful about medication changes. Rates depression 6/10. Denies SI. Attending groups and offering emotional support to peers. Affect constricted. A: On q 15 minute checks. Given meds. R: Engaged in treatment.

## 2015-08-13 MED ORDER — PANTOPRAZOLE SODIUM 40 MG PO TBEC: 40.0000 mg | DELAYED_RELEASE_TABLET | Freq: Every day | ORAL | Status: DC

## 2015-08-13 MED ORDER — HYDROXYZINE HCL 25 MG PO TABS: 25.0000 mg | ORAL_TABLET | Freq: Two times a day (BID) | ORAL | Status: DC | PRN

## 2015-08-13 MED ORDER — CLONAZEPAM 0.5 MG PO TABS: 0.5000 mg | ORAL_TABLET | Freq: Three times a day (TID) | ORAL | Status: AC

## 2015-08-13 MED ORDER — ASENAPINE MALEATE 5 MG SL SUBL: 5.0000 mg | SUBLINGUAL_TABLET | Freq: Every day | SUBLINGUAL | Status: DC

## 2015-08-13 MED ORDER — MELOXICAM 15 MG PO TABS: 15.0000 mg | ORAL_TABLET | Freq: Every day | ORAL | Status: DC

## 2015-08-13 MED ORDER — VENLAFAXINE HCL ER 75 MG PO CP24: 225.0000 mg | ORAL_CAPSULE | Freq: Every day | ORAL | Status: DC

## 2015-08-13 MED ORDER — METHOCARBAMOL 750 MG PO TABS: 750.0000 mg | ORAL_TABLET | Freq: Three times a day (TID) | ORAL | Status: DC | PRN

## 2015-08-13 MED ORDER — ZIPRASIDONE HCL 40 MG PO CAPS: 40.0000 mg | ORAL_CAPSULE | Freq: Every day | ORAL | Status: DC

## 2015-08-13 MED ORDER — LIDOCAINE VISCOUS 2 % MT SOLN: 15.0000 mL | Freq: Two times a day (BID) | OROMUCOSAL | Status: DC

## 2015-08-13 MED ORDER — TEMAZEPAM 15 MG PO CAPS: 15.0000 mg | ORAL_CAPSULE | Freq: Every evening | ORAL | Status: AC | PRN

## 2015-08-13 MED ORDER — LIDOCAINE 5 % EX PTCH
1.0000 | MEDICATED_PATCH | CUTANEOUS | Status: DC
Start: 2015-08-13 — End: 2015-08-14

## 2015-08-13 MED ORDER — FENTANYL 25 MCG/HR TD PT72: 25.0000 ug | MEDICATED_PATCH | TRANSDERMAL | Status: AC

## 2015-08-13 MED ORDER — TEMAZEPAM 15 MG PO CAPS: 15.0000 mg | ORAL_CAPSULE | Freq: Every evening | ORAL | Status: DC | PRN

## 2015-08-13 MED ORDER — OXCARBAZEPINE 300 MG PO TABS: 300.0000 mg | ORAL_TABLET | Freq: Two times a day (BID) | ORAL | Status: DC

## 2015-08-13 MED ORDER — NICOTINE 21 MG/24HR TD PT24: 21.0000 mg | MEDICATED_PATCH | Freq: Every day | TRANSDERMAL | Status: AC

## 2015-08-13 MED ORDER — ATORVASTATIN CALCIUM 20 MG PO TABS: 20.0000 mg | ORAL_TABLET | Freq: Every day | ORAL | Status: DC

## 2015-08-13 MED ORDER — PRAZOSIN HCL 2 MG PO CAPS: 2.0000 mg | ORAL_CAPSULE | Freq: Every day | ORAL | Status: DC
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
## 2015-08-13 NOTE — Progress Notes (Signed)
  [facility] :  Will you be returning to the same living situation after discharge:  Yes,    At discharge, do you have transportation home?: Yes,    Do you have the ability to pay for your medications: Yes,     Release of information consent forms completed and in the chart;  Patient's signature needed at discharge.  Patient to Follow up at: Follow-up Information    Follow up with [facility]. Go on 08/22/2015.   Why:  1:15pm , Hospital Follow up, Outpatient Medication Management, Therapy, Please reschedule if unable to make appointment.   Contact information:   [address] [place], [place] [phone] Fax-[phone]      Patient denies SI/HI: Yes,       Safety Planning and Suicide Prevention discussed: Yes,     Have you used any form of tobacco in the last 30 days? (Cigarettes, Smokeless Tobacco, Cigars, and/or Pipes): Yes  Has patient been referred to the Quitline?: Patient refused referral  Glennon MacLaws, Dago Jungwirth P, MSW, LCSW 08/13/2015, 3:26 PM

## 2015-08-13 NOTE — [facility] Suicide Risk Assessment (Signed)
[facility] INPATIENT:  Family/Significant Other Suicide Prevention Education  Suicide Prevention Education:  Patient Refusal for Family/Significant Other Suicide Prevention Education: The patient Sean Wyatt has refused to provide written consent for family/significant other to be provided Family/Significant Other Suicide Prevention Education during admission and/or prior to discharge.  Physician notified.  Glennon MacLaws, Uzziel Russey P, MSW, LCSW 08/13/2015, 3:26 PM

## 2015-08-13 NOTE — Progress Notes (Signed)
D: Patient states he is feeling better and looking forward to being discharged tomorrow. Denies SI/HI/AVH. Hopeful about med adjustments. A: On q 15 minute checks. Offered emotional support. Given meds. R: Pleasant and cooperative.

## 2015-08-13 NOTE — Progress Notes (Signed)
D: Patient denies SI/HI/AVH.  Patient affect and mood are anxious.  Patient did attend evening group. Patient visible on the milieu. No distress noted. A: Support and encouragement offered. Scheduled medications given to pt. Q 15 min checks continued for patient safety. R: Patient receptive. Patient remains safe on the unit.   

## 2015-08-13 NOTE — Tx Team (Signed)
Interdisciplinary Treatment Plan Update (Adult)  Date:  08/13/2015 Time Reviewed:  3:27 PM  Progress in Treatment: Attending groups: Yes. Participating in groups:  Yes. Taking medication as prescribed:  Yes. Tolerating medication:  Yes. Family/Significant othe contact made:  No, will contact:  Pt refused contact Patient understands diagnosis:  Yes. Discussing patient identified problems/goals with staff:  Yes. Medical problems stabilized or resolved:  Yes. Denies suicidal/homicidal ideation: Yes. Issues/concerns per patient self-inventory:  No. Other:  New problem(s) identified: No, Describe:     Discharge Plan or Barriers:  Pt will discharge home to [place] and follow up with [facility] on 11/10 at 1:15pm for outpatient med management and therapy  Reason for Continuation of Hospitalization: Medication stabilization Other; describe Coordinating after care  Comments:  Pt improving, still slightly hy[po-manic but verbalizes he is less depressed and feels he will be able to get home safely soon.  Pt will have to drive himself back to [place].  Pt verbalizes some drowsiness from Geodon.  Estimated length of stay:1-2 days  New goal(s):  Review of initial/current patient goals per problem list:   See Plan of care  Attendees: Patient:  Sean Wyatt 11/1/20163:27 PM  Family:   11/1/20163:27 PM  Physician:  Kristine LineaJolanta Pucilowska 11/1/20163:27 PM  Nursing:   Hulan AmatoGwen Farrish, RN 11/1/20163:27 PM  Case Manager:   11/1/20163:27 PM  Counselor:  Jake SharkSara Victorino Fatzinger, LCSW 11/1/20163:27 PM  Other:  Hershal CoriaBeth Greene, LRT 11/1/20163:27 PM  Other:  Beryl MeagerJason Ingle,  LCSWA 11/1/20163:27 PM  Other:   11/1/20163:27 PM  Other:  11/1/20163:27 PM  Other:  11/1/20163:27 PM  Other:  11/1/20163:27 PM  Other:  11/1/20163:27 PM  Other:  11/1/20163:27 PM  Other:  11/1/20163:27 PM  Other:   11/1/20163:27 PM   Scribe for Treatment Team:   Glennon MacLaws, Osamu Olguin P, 08/13/2015, 3:27 PM
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
## 2015-08-13 NOTE — Progress Notes (Signed)
Recreation Therapy Notes  Date: 11.01.16 Time: 3:00 pm Location: Craft Room  Group Topic: Self-expression  Goal Area(s) Addresses:  Patient will pick one color for each emotion listed. Patient will show where they feel that emotion on the worksheet.  Behavioral Response: Attentive, Interactive  Intervention: Color Your Emotions  Activity: Patients were given a human body worksheet and instructed to pick a color for 5 different emotions and show on the worksheet where they feel those emotions.  Education: LRT educated patients on different forms of self-expression.  Education Outcome: Acknowledges education/In group clarification offered   Clinical Observations/Feedback: Patient completed activity by picking a color for each emotion and showing where he felt that emotion on the worksheet. Patient contributed to group discussion by stating what colors he picked for certain emotions and why, where he felt certain emotions, and activities he can do to help him calm down.  Jacquelynn CreeGreene,Sean Wyatt M, LRT/CTRS 08/13/2015 4:21 PM

## 2015-08-14 MED ORDER — METHOCARBAMOL 750 MG PO TABS: 750.0000 mg | ORAL_TABLET | Freq: Three times a day (TID) | ORAL | Status: AC | PRN

## 2015-08-14 MED ORDER — OXCARBAZEPINE 300 MG PO TABS: 300.0000 mg | ORAL_TABLET | Freq: Two times a day (BID) | ORAL | Status: AC

## 2015-08-14 MED ORDER — LIDOCAINE VISCOUS 2 % MT SOLN: 15.0000 mL | Freq: Two times a day (BID) | OROMUCOSAL | Status: AC

## 2015-08-14 MED ORDER — ATORVASTATIN CALCIUM 20 MG PO TABS: 20.0000 mg | ORAL_TABLET | Freq: Every day | ORAL | Status: AC

## 2015-08-14 MED ORDER — ZIPRASIDONE HCL 40 MG PO CAPS: 40.0000 mg | ORAL_CAPSULE | Freq: Every day | ORAL | Status: AC

## 2015-08-14 MED ORDER — HYDROXYZINE HCL 25 MG PO TABS: 25.0000 mg | ORAL_TABLET | Freq: Two times a day (BID) | ORAL | Status: AC | PRN

## 2015-08-14 MED ORDER — ASENAPINE MALEATE 5 MG SL SUBL: 5.0000 mg | SUBLINGUAL_TABLET | Freq: Every day | SUBLINGUAL | Status: AC

## 2015-08-14 MED ORDER — VENLAFAXINE HCL ER 75 MG PO CP24: 225.0000 mg | ORAL_CAPSULE | Freq: Every day | ORAL | Status: AC

## 2015-08-14 MED ORDER — MELOXICAM 15 MG PO TABS: 15.0000 mg | ORAL_TABLET | Freq: Every day | ORAL | Status: AC

## 2015-08-14 MED ORDER — LIDOCAINE 5 % EX PTCH: 1.0000 | MEDICATED_PATCH | CUTANEOUS | Status: AC

## 2015-08-14 MED ORDER — PRAZOSIN HCL 2 MG PO CAPS: 2.0000 mg | ORAL_CAPSULE | Freq: Every day | ORAL | Status: AC

## 2015-08-14 MED ORDER — PANTOPRAZOLE SODIUM 40 MG PO TBEC: 40.0000 mg | DELAYED_RELEASE_TABLET | Freq: Every day | ORAL | Status: AC

## 2015-08-14 NOTE — Progress Notes (Signed)
D: Patient denies SI/HI/AVH.  Patient affect is appropriate and his mood is pleasant.  Patient talked with staff about upcoming discharge and the excitement surrounding the birth of his new grandchild. Patient did NOT attend evening group. Patient visible on the milieu. No distress noted. A: Support and encouragement offered. Scheduled medications given to pt. Q 15 min checks continued for patient safety. R: Patient receptive. Patient remains safe on the unit.

## 2015-08-14 NOTE — [facility] Group Notes (Signed)
[facility] Group Notes:  (Nursing/MHT/Case Management/Adjunct)  Date:  08/14/2015  Time:  12:06 PM  Type of Therapy:  Psychoeducational Skills  Participation Level:  Active  Participation Quality:  Appropriate, Attentive and Sharing  Affect:  Appropriate  Cognitive:  Appropriate  Insight:  Appropriate  Engagement in Group:  Engaged and Supportive  Modes of Intervention:  Support  Summary of Progress/Problems:  Sean ReddenOlivia Briana Wyatt 08/14/2015, 12:06 PM

## 2015-08-14 NOTE — Progress Notes (Signed)
D: pt aware of discharge this shift, pt denies suicidal ideation or homicidal ideation, pt calm and cooperative, no distress noted  A: all personal items in locker returned to patient, instructions given on discharge information, received prescriptions and follow up appointment  R: patient states he will comply with outpatient services and medications as prescribed.  Patient left via his own car.   

## 2015-08-14 NOTE — Progress Notes (Signed)
Recreation Therapy Notes  INPATIENT RECREATION TR PLAN  Patient Details Name: Sean Wyatt MRN: [id number] DOB: [birth year] Today's Date: 08/14/2015  Rec Therapy Plan Is patient appropriate for Therapeutic Recreation?: Yes Treatment times per week: At least once a week TR Treatment/Interventions: 1:1 session, Group participation (Comment) (Appropriate participation in daily recreation therapy tx)  Discharge Criteria Pt will be discharged from therapy if:: Treatment goals are met, Discharged Treatment plan/goals/alternatives discussed and agreed upon by:: Patient/family  Discharge Summary Short term goals set: See Care Plan Short term goals met: Complete Progress toward goals comments: One-to-one attended Which groups?: Wellness, Other (Comment), Communication (Problem Solving, Teamwork, Self-expression) One-to-one attended: Self-esteem, stress management Reason goals not met: N/A Therapeutic equipment acquired: None Reason patient discharged from therapy: Discharge from hospital Pt/family agrees with progress & goals achieved: Yes Date patient discharged from therapy: 08/14/15   Leonette Monarch, LRT/CTRS 08/14/2015, 4:48 PM
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
## 2015-08-14 NOTE — [facility] Group Notes (Signed)
[facility] Group Notes:  (Nursing/MHT/Case Management/Adjunct)  Date:  08/14/2015  Time:  5:21 AM  Type of Therapy:  Group Therapy  Participation Level:  Did Not Attend    Summary of Progress/Problems:  Veva Holesshley Imani Hazleigh Mccleave 08/14/2015, 5:21 AM

## 2017-08-01 IMAGING — MR MR HEAD W/O CM
9 of 11 series · 33 of 48 positions shown · non-contrast
Comparison: CT angiogram of the head July 23, 2015 at 6888
hours.

CLINICAL DATA: Acute onset LEFT facial numbness while driving,
numbness extended to arm, severe headache and blurry vision. History
of seizures.

EXAM:
MRI HEAD WITHOUT CONTRAST
TECHNIQUE: Multiplanar, multiecho pulse sequences of the brain and surrounding
structures were obtained without intravenous contrast.

[Series 3: DWI · axial · 3.6mm · 0.94mm/px · z∈[-120,+30]mm · 8 of 86 slices shown (1 of 4)]
[im 1/86]
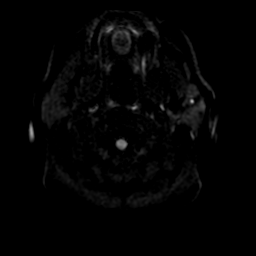
[im 13/86]
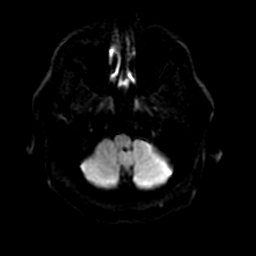
[im 25/86]
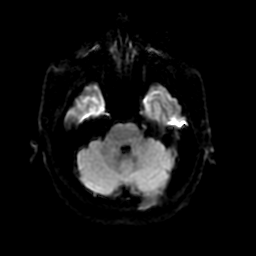
[im 37/86]
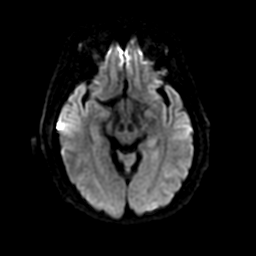
[im 49/86]
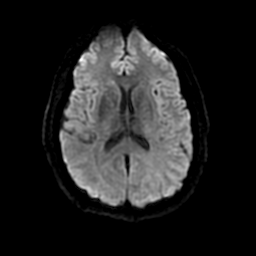
[im 61/86]
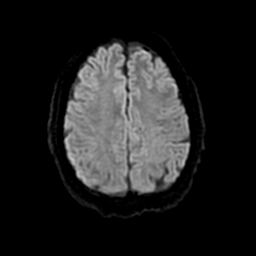
[im 73/86]
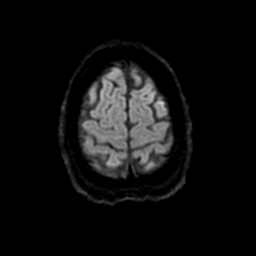
[im 86/86]
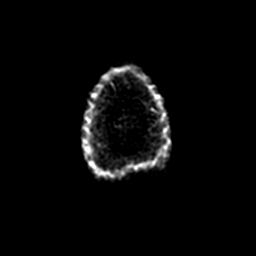

[Series 4: FLAIR · sagittal · 5.0mm · 0.47mm/px · 2 of 23 slices shown (1 of 2)]
[im 1/23]
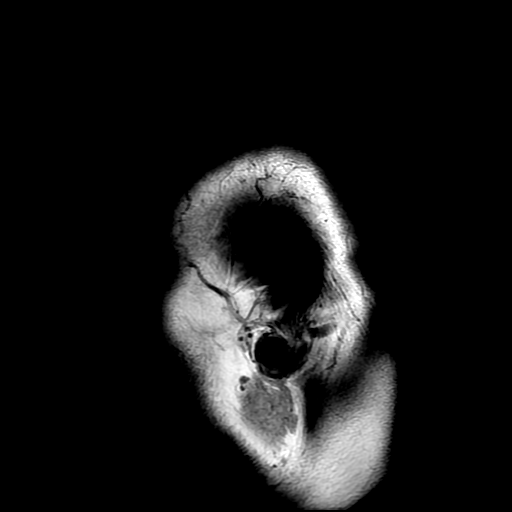
[im 23/23]
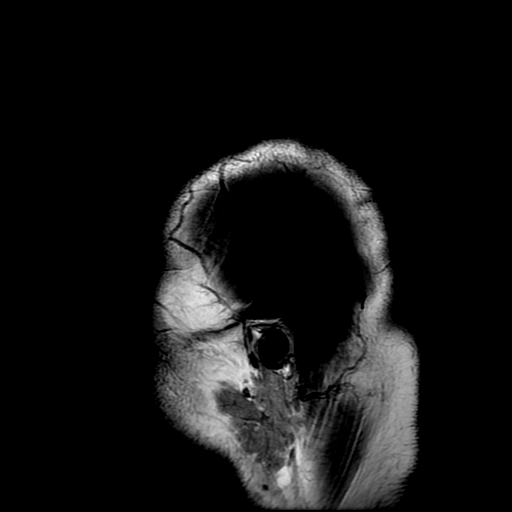

[Series 5: DWI · coronal · 5.0mm · 0.94mm/px · 6 of 72 slices shown (2 of 4)]
[im 1/72]
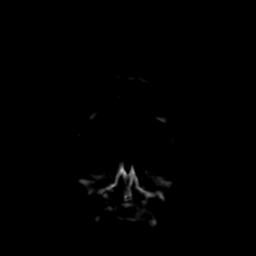
[im 15/72]
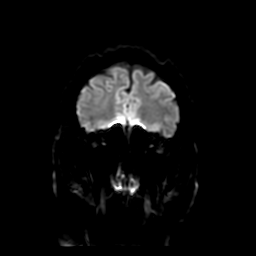
[im 29/72]
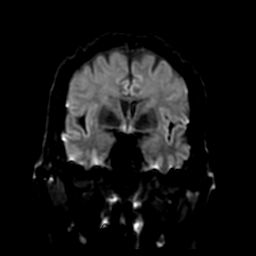
[im 43/72]
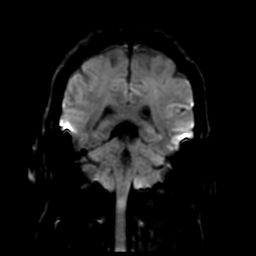
[im 57/72]
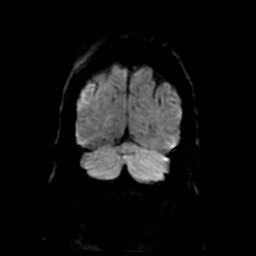
[im 72/72]
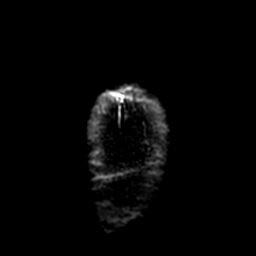

[Series 6: T2 · axial · 5.0mm · 0.47mm/px · z∈[-110,+31]mm · 2 of 25 slices shown (1 of 2)]
[im 1/25]
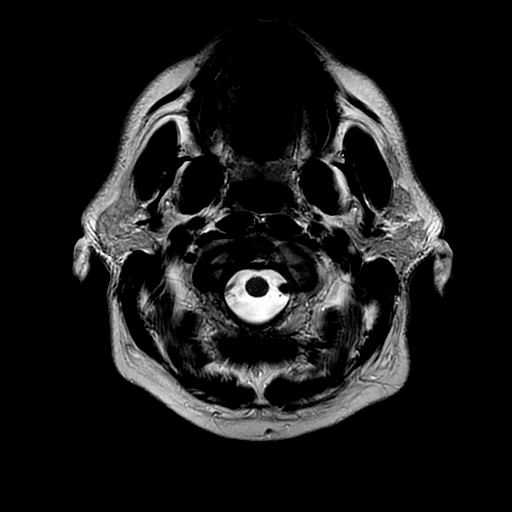
[im 25/25]
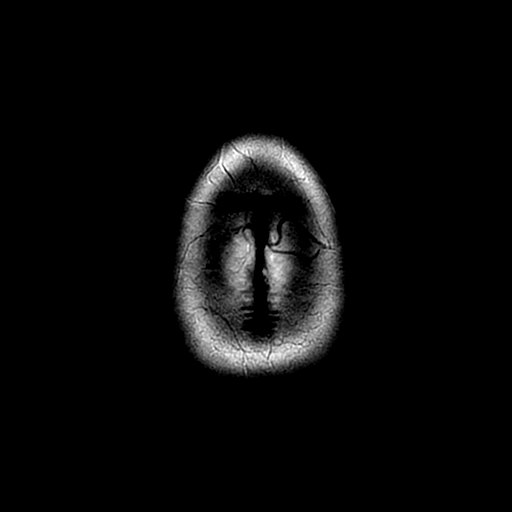

[Series 7: FLAIR · axial · 5.0mm · 0.47mm/px · z∈[-110,+31]mm · 2 of 25 slices shown (2 of 2)]
[im 1/25]
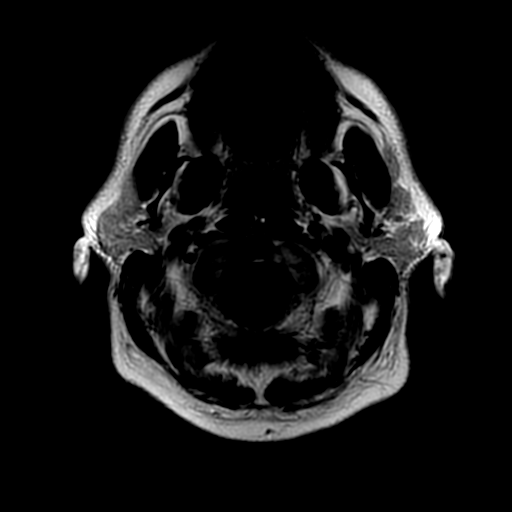
[im 25/25]
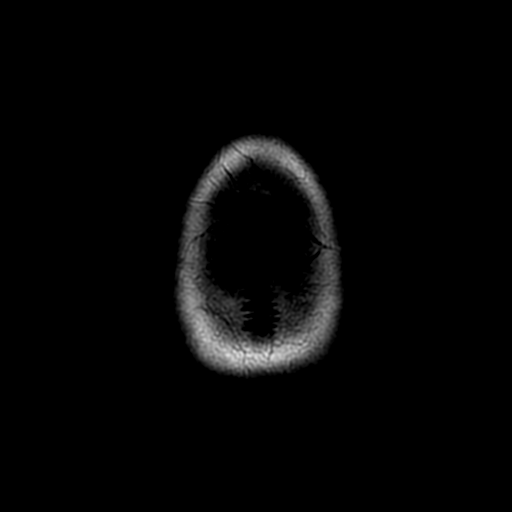

[Series 8: (person_name) · axial · 3.0mm · 0.47mm/px · z∈[-113,-30]mm · 5 of 100 slices shown]
[im 1/100]
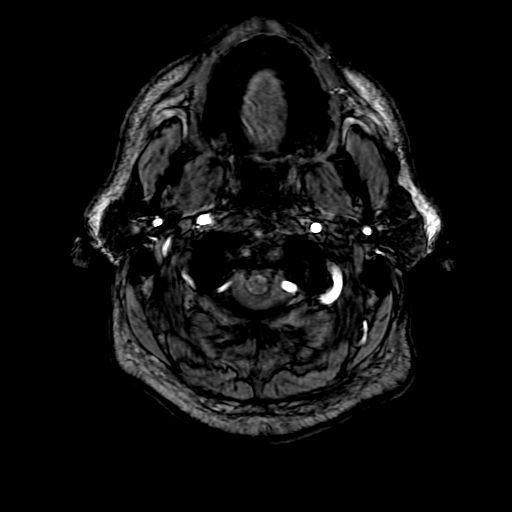
[im 15/100]
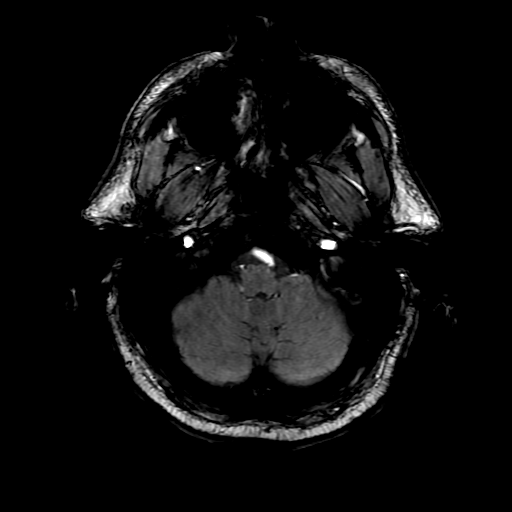
[im 29/100]
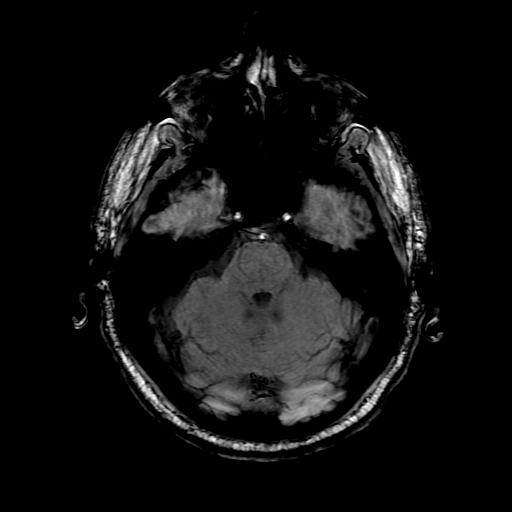
[im 43/100]
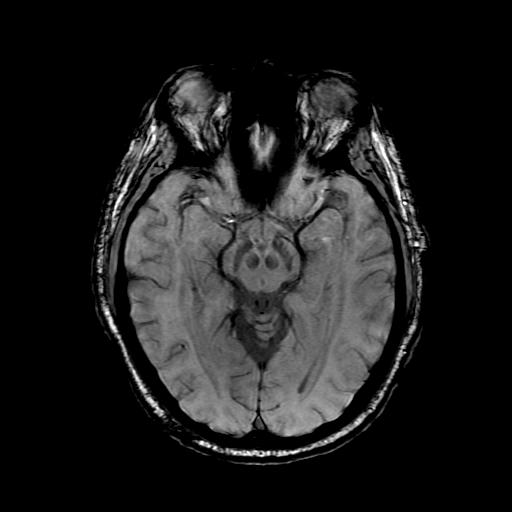
[im 57/100]
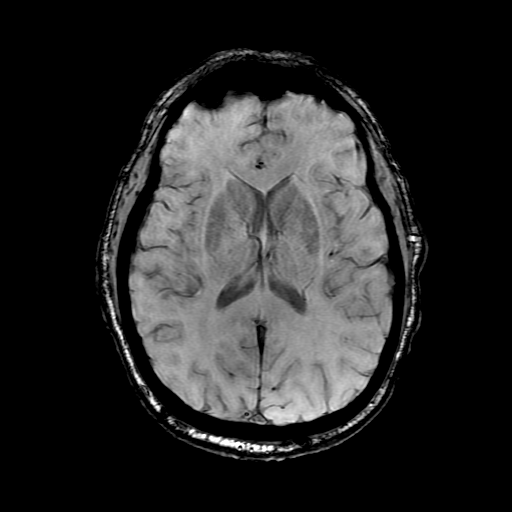

[Series 10: T2 · coronal · 5.0mm · 0.39mm/px · 2 of 30 slices shown (2 of 2)]
[im 1/30]
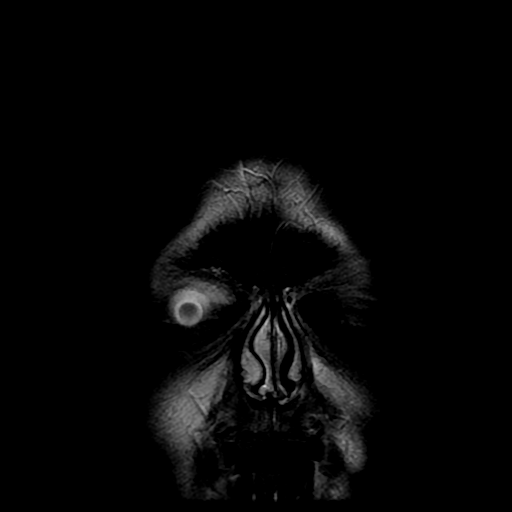
[im 30/30]
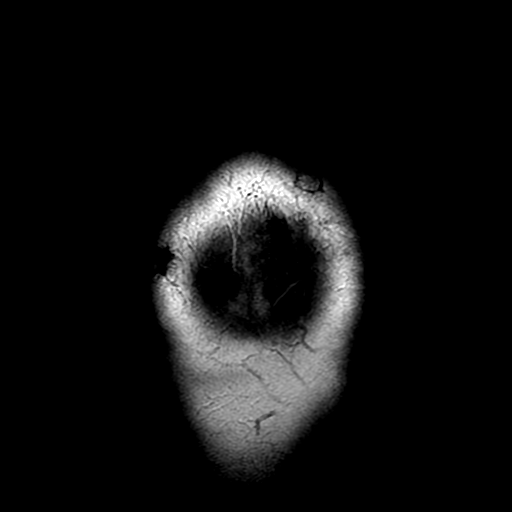

[Series 300: DWI · axial · 3.6mm · 0.94mm/px · z∈[-120,+30]mm · 3 of 43 slices shown (3 of 4)]
[im 1/43]
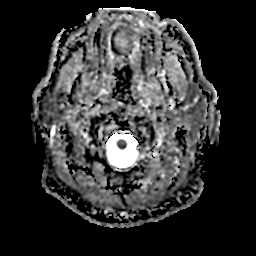
[im 22/43]
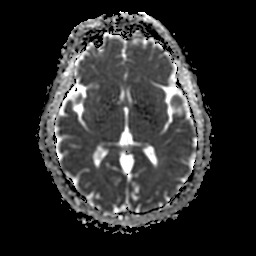
[im 43/43]
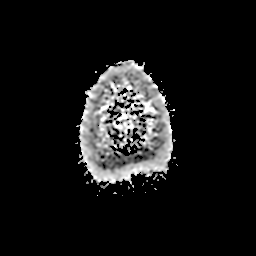

[Series 500: DWI · coronal · 5.0mm · 0.94mm/px · 3 of 36 slices shown (4 of 4)]
[im 1/36]
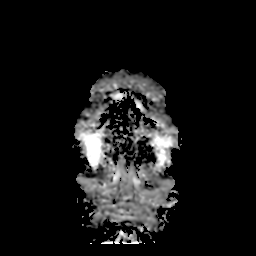
[im 18/36]
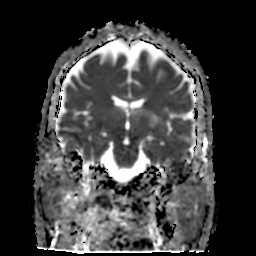
[im 36/36]
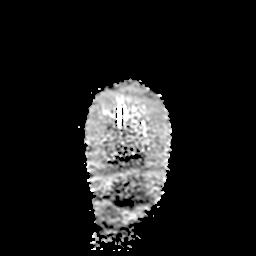

[33 of 48 positions shown; findings below may reference images not displayed]

FINDINGS: The ventricles and sulci are normal for patient's age. No abnormal
parenchymal signal, mass lesions, mass effect. No reduced diffusion
to suggest acute ischemia. No susceptibility artifact to suggest
hemorrhage.

No abnormal extra-axial fluid collections. No extra-axial masses
though, contrast enhanced sequences would be more sensitive. Normal
major intracranial vascular flow voids seen at the skull base.

Ocular globes and orbital contents are unremarkable though not
tailored for evaluation. No abnormal sellar expansion. Visualized
paranasal sinuses and mastoid air cells are well-aerated. No
suspicious calvarial bone marrow signal. RIGHT parietal occipital
scalp scarring. Craniocervical junction maintained.
IMPRESSION: Normal noncontrast MRI of the brain.
